# Patient Record
Sex: Female | Born: 1985 | Race: White | Hispanic: No | Marital: Married | Smoking: Never smoker
Health system: Southern US, Community
[De-identification: ages and names within clinical notes are randomized; demographics above are authoritative.]

## PROBLEM LIST (undated history)

## (undated) DIAGNOSIS — R51 Headache: Secondary | ICD-10-CM

## (undated) DIAGNOSIS — Q211 Atrial septal defect, unspecified: Secondary | ICD-10-CM

## (undated) DIAGNOSIS — Z8619 Personal history of other infectious and parasitic diseases: Secondary | ICD-10-CM

## (undated) HISTORY — DX: Atrial septal defect: Q21.1

## (undated) HISTORY — DX: Headache: R51

## (undated) HISTORY — DX: Personal history of other infectious and parasitic diseases: Z86.19

## (undated) HISTORY — DX: Atrial septal defect, unspecified: Q21.10

---
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
## 2006-09-09 ENCOUNTER — Other Ambulatory Visit: Admission: RE | Admit: 2006-09-09 | Discharge: 2006-09-09 | Payer: Self-pay | Admitting: *Deleted

## 2007-10-23 ENCOUNTER — Other Ambulatory Visit: Admission: RE | Admit: 2007-10-23 | Discharge: 2007-10-23 | Payer: Self-pay | Admitting: Obstetrics and Gynecology

## 2008-11-17 ENCOUNTER — Other Ambulatory Visit: Admission: RE | Admit: 2008-11-17 | Discharge: 2008-11-17 | Payer: Self-pay | Admitting: Obstetrics and Gynecology

## 2009-06-14 ENCOUNTER — Encounter: Admission: RE | Admit: 2009-06-14 | Discharge: 2009-06-14 | Payer: Self-pay | Admitting: Obstetrics and Gynecology

## 2009-11-29 ENCOUNTER — Other Ambulatory Visit: Admission: RE | Admit: 2009-11-29 | Discharge: 2009-11-29 | Payer: Self-pay | Admitting: Obstetrics and Gynecology

## 2009-12-07 ENCOUNTER — Encounter: Admission: RE | Admit: 2009-12-07 | Discharge: 2009-12-07 | Payer: Self-pay | Admitting: Obstetrics and Gynecology

## 2010-05-08 ENCOUNTER — Other Ambulatory Visit: Payer: Self-pay | Admitting: Obstetrics and Gynecology

## 2010-05-08 DIAGNOSIS — Z09 Encounter for follow-up examination after completed treatment for conditions other than malignant neoplasm: Secondary | ICD-10-CM

## 2010-05-31 ENCOUNTER — Ambulatory Visit
Admission: RE | Admit: 2010-05-31 | Discharge: 2010-05-31 | Disposition: A | Discharge status: Home / Self Care | Payer: No Typology Code available for payment source | Source: Ambulatory Visit | Attending: Obstetrics and Gynecology | Admitting: Obstetrics and Gynecology

## 2010-05-31 DIAGNOSIS — Z09 Encounter for follow-up examination after completed treatment for conditions other than malignant neoplasm: Secondary | ICD-10-CM

## 2011-01-05 ENCOUNTER — Other Ambulatory Visit: Payer: Self-pay

## 2011-01-05 LAB — OB RESULTS CONSOLE ABO/RH: RH Type: POSITIVE

## 2011-01-05 LAB — OB RESULTS CONSOLE RUBELLA ANTIBODY, IGM: Rubella: IMMUNE

## 2011-01-05 LAB — OB RESULTS CONSOLE HIV ANTIBODY (ROUTINE TESTING): HIV: NONREACTIVE

## 2011-01-31 ENCOUNTER — Other Ambulatory Visit: Payer: Self-pay | Admitting: Obstetrics and Gynecology

## 2011-01-31 ENCOUNTER — Other Ambulatory Visit (HOSPITAL_COMMUNITY)
Admission: RE | Admit: 2011-01-31 | Discharge: 2011-01-31 | Disposition: A | Discharge status: Home / Self Care | Payer: No Typology Code available for payment source | Source: Ambulatory Visit | Attending: Obstetrics and Gynecology | Admitting: Obstetrics and Gynecology

## 2011-01-31 DIAGNOSIS — Z01419 Encounter for gynecological examination (general) (routine) without abnormal findings: Secondary | ICD-10-CM | POA: Insufficient documentation

## 2011-01-31 DIAGNOSIS — Z113 Encounter for screening for infections with a predominantly sexual mode of transmission: Secondary | ICD-10-CM | POA: Insufficient documentation

## 2011-02-20 NOTE — L&D Delivery Note (Signed)
Delivery Note At 11:43 PM a viable female was delivered via Vaginal, Spontaneous Delivery (Presentation: Right Occiput Anterior).  APGAR: 8, 9; weight .   Placenta status: Intact, Spontaneous.  Cord: 3 vessels with the following complications: None.  Cord pH: na  Anesthesia: Epidural  Episiotomy: None Lacerations: 2nd degree Suture Repair: 3.0 vicryl Est. Blood Loss (mL): 300  Mom to postpartum.  Baby to nursery-stable.  Torien Ramroop J. 08/16/2011, 12:03 AM

## 2011-06-20 ENCOUNTER — Other Ambulatory Visit (HOSPITAL_COMMUNITY): Payer: Self-pay | Admitting: Obstetrics and Gynecology

## 2011-06-20 DIAGNOSIS — IMO0002 Reserved for concepts with insufficient information to code with codable children: Secondary | ICD-10-CM

## 2011-06-27 ENCOUNTER — Ambulatory Visit (HOSPITAL_COMMUNITY)
Admission: RE | Admit: 2011-06-27 | Discharge: 2011-06-27 | Disposition: A | Discharge status: Home / Self Care | Payer: No Typology Code available for payment source | Source: Ambulatory Visit | Attending: Obstetrics and Gynecology | Admitting: Obstetrics and Gynecology

## 2011-06-27 ENCOUNTER — Other Ambulatory Visit (HOSPITAL_COMMUNITY): Payer: Self-pay | Admitting: Obstetrics and Gynecology

## 2011-06-27 ENCOUNTER — Encounter (HOSPITAL_COMMUNITY): Payer: Self-pay

## 2011-06-27 DIAGNOSIS — O358XX0 Maternal care for other (suspected) fetal abnormality and damage, not applicable or unspecified: Secondary | ICD-10-CM | POA: Insufficient documentation

## 2011-06-27 DIAGNOSIS — O368390 Maternal care for abnormalities of the fetal heart rate or rhythm, unspecified trimester, not applicable or unspecified: Secondary | ICD-10-CM | POA: Insufficient documentation

## 2011-06-27 DIAGNOSIS — IMO0002 Reserved for concepts with insufficient information to code with codable children: Secondary | ICD-10-CM

## 2011-06-27 DIAGNOSIS — O352XX0 Maternal care for (suspected) hereditary disease in fetus, not applicable or unspecified: Secondary | ICD-10-CM | POA: Insufficient documentation

## 2011-06-27 DIAGNOSIS — Z3689 Encounter for other specified antenatal screening: Secondary | ICD-10-CM

## 2011-06-27 DIAGNOSIS — Z363 Encounter for antenatal screening for malformations: Secondary | ICD-10-CM | POA: Insufficient documentation

## 2011-06-27 DIAGNOSIS — Z1389 Encounter for screening for other disorder: Secondary | ICD-10-CM | POA: Insufficient documentation

## 2011-06-27 NOTE — Progress Notes (Signed)
Patient seen for ultrasound.  See full report in [facility].  Single IUP at 33 4/7 weeks. Rare premature atrial contractions noted with normal heart rate. Normal anatomic fetal survey.  The fetal heart anatomy appears normal; however, somewhat limited due to advanced gestational age Fetal growth is appropriate Normal amniotic fluid volume  Premature atrial contractions are benign dysrhythmias that resolve after delivery and require no additional evaluation unless persistent fetal tachycardia develops (> 200 bpm). Recommend follow up as clinically indicated.  Alpha Gula, MD

## 2011-06-28 ENCOUNTER — Encounter (HOSPITAL_COMMUNITY): Payer: Self-pay

## 2011-06-28 NOTE — Progress Notes (Signed)
Encounter addended by: Alessandra Bevels. Chase Picket, RN on: 06/28/2011  1:51 PM<BR>     Documentation filed: Episodes

## 2011-07-03 ENCOUNTER — Inpatient Hospital Stay (HOSPITAL_COMMUNITY): Admission: AD | Admit: 2011-07-03 | Payer: Self-pay | Source: Ambulatory Visit | Admitting: Obstetrics and Gynecology

## 2011-08-09 ENCOUNTER — Encounter (HOSPITAL_COMMUNITY): Payer: Self-pay | Admitting: *Deleted

## 2011-08-09 ENCOUNTER — Telehealth (HOSPITAL_COMMUNITY): Payer: Self-pay | Admitting: *Deleted

## 2011-08-09 NOTE — Telephone Encounter (Signed)
Preadmission screen  

## 2011-08-14 ENCOUNTER — Encounter (HOSPITAL_COMMUNITY): Payer: Self-pay

## 2011-08-14 ENCOUNTER — Inpatient Hospital Stay (HOSPITAL_COMMUNITY)
Admission: RE | Admit: 2011-08-14 | Discharge: 2011-08-17 | DRG: 775 | Disposition: A | Discharge status: Home / Self Care | Payer: No Typology Code available for payment source | Source: Ambulatory Visit | Attending: Obstetrics and Gynecology | Admitting: Obstetrics and Gynecology

## 2011-08-14 ENCOUNTER — Other Ambulatory Visit: Payer: Self-pay | Admitting: Obstetrics and Gynecology

## 2011-08-14 DIAGNOSIS — O480 Post-term pregnancy: Principal | ICD-10-CM | POA: Diagnosis present

## 2011-08-14 LAB — CBC
MCH: 34.1 pg — ABNORMAL HIGH (ref 26.0–34.0)
MCV: 96.7 fL (ref 78.0–100.0)
Platelets: 164 10*3/uL (ref 150–400)
RBC: 3.96 MIL/uL (ref 3.87–5.11)

## 2011-08-14 MED ORDER — LIDOCAINE HCL (PF) 1 % IJ SOLN
30.0000 mL | INTRAMUSCULAR | Status: DC | PRN
  Filled 2011-08-14: qty 30

## 2011-08-14 MED ORDER — IBUPROFEN 600 MG PO TABS: 600.0000 mg | ORAL_TABLET | Freq: Four times a day (QID) | ORAL | Status: DC | PRN

## 2011-08-14 MED ORDER — OXYTOCIN 40 UNITS IN LACTATED RINGERS INFUSION - SIMPLE MED: 62.5000 mL/h | Freq: Once | INTRAVENOUS | Status: DC

## 2011-08-14 MED ORDER — MISOPROSTOL 25 MCG QUARTER TABLET
25.0000 ug | ORAL_TABLET | ORAL | Status: DC | PRN
  Administered 2011-08-14 – 2011-08-15 (×2): 25 ug via VAGINAL
  Filled 2011-08-14 (×2): qty 0.25

## 2011-08-14 MED ORDER — LACTATED RINGERS IV SOLN
500.0000 mL | INTRAVENOUS | Status: DC | PRN
  Administered 2011-08-15 (×3): 1000 mL via INTRAVENOUS

## 2011-08-14 MED ORDER — OXYTOCIN BOLUS FROM INFUSION
250.0000 mL | Freq: Once | INTRAVENOUS | Status: DC
  Filled 2011-08-14: qty 500

## 2011-08-14 MED ORDER — LACTATED RINGERS IV SOLN
INTRAVENOUS | Status: DC
  Administered 2011-08-15 (×3): via INTRAVENOUS

## 2011-08-14 MED ORDER — FLEET ENEMA 7-19 GM/118ML RE ENEM: 1.0000 | ENEMA | RECTAL | Status: DC | PRN

## 2011-08-14 MED ORDER — ONDANSETRON HCL 4 MG/2ML IJ SOLN: 4.0000 mg | Freq: Four times a day (QID) | INTRAMUSCULAR | Status: DC | PRN

## 2011-08-14 MED ORDER — OXYTOCIN 40 UNITS IN LACTATED RINGERS INFUSION - SIMPLE MED
1.0000 m[IU]/min | INTRAVENOUS | Status: DC
  Administered 2011-08-15: 1 m[IU]/min via INTRAVENOUS
  Filled 2011-08-14: qty 1000

## 2011-08-14 MED ORDER — ACETAMINOPHEN 325 MG PO TABS: 650.0000 mg | ORAL_TABLET | ORAL | Status: DC | PRN

## 2011-08-14 MED ORDER — BUTORPHANOL TARTRATE 2 MG/ML IJ SOLN
1.0000 mg | INTRAMUSCULAR | Status: DC | PRN
  Administered 2011-08-15: 1 mg via INTRAVENOUS
  Filled 2011-08-14: qty 1

## 2011-08-14 MED ORDER — OXYCODONE-ACETAMINOPHEN 5-325 MG PO TABS: 1.0000 | ORAL_TABLET | ORAL | Status: DC | PRN

## 2011-08-14 MED ORDER — CITRIC ACID-SODIUM CITRATE 334-500 MG/5ML PO SOLN: 30.0000 mL | ORAL | Status: DC | PRN

## 2011-08-14 MED ORDER — TERBUTALINE SULFATE 1 MG/ML IJ SOLN
0.2500 mg | Freq: Once | INTRAMUSCULAR | Status: AC | PRN
  Filled 2011-08-14: qty 1

## 2011-08-15 ENCOUNTER — Inpatient Hospital Stay (HOSPITAL_COMMUNITY): Payer: No Typology Code available for payment source | Admitting: Anesthesiology

## 2011-08-15 ENCOUNTER — Encounter (HOSPITAL_COMMUNITY): Payer: Self-pay | Admitting: Anesthesiology

## 2011-08-15 ENCOUNTER — Encounter (HOSPITAL_COMMUNITY): Payer: Self-pay

## 2011-08-15 MED ORDER — EPHEDRINE 5 MG/ML INJ: 10.0000 mg | INTRAVENOUS | Status: DC | PRN

## 2011-08-15 MED ORDER — LIDOCAINE HCL (PF) 1 % IJ SOLN
INTRAMUSCULAR | Status: DC | PRN
  Administered 2011-08-15 (×2): 9 mL

## 2011-08-15 MED ORDER — PHENYLEPHRINE 40 MCG/ML (10ML) SYRINGE FOR IV PUSH (FOR BLOOD PRESSURE SUPPORT)
80.0000 ug | PREFILLED_SYRINGE | INTRAVENOUS | Status: DC | PRN
  Filled 2011-08-15: qty 5

## 2011-08-15 MED ORDER — FENTANYL 2.5 MCG/ML BUPIVACAINE 1/10 % EPIDURAL INFUSION (WH - ANES)
INTRAMUSCULAR | Status: DC | PRN
  Administered 2011-08-15: 14 mL/h via EPIDURAL

## 2011-08-15 MED ORDER — TERBUTALINE SULFATE 1 MG/ML IJ SOLN
0.2500 mg | Freq: Once | INTRAMUSCULAR | Status: AC
  Administered 2011-08-15: 0.25 mg via SUBCUTANEOUS

## 2011-08-15 MED ORDER — DIPHENHYDRAMINE HCL 50 MG/ML IJ SOLN: 12.5000 mg | INTRAMUSCULAR | Status: DC | PRN

## 2011-08-15 MED ORDER — EPHEDRINE 5 MG/ML INJ
10.0000 mg | INTRAVENOUS | Status: DC | PRN
  Filled 2011-08-15: qty 4

## 2011-08-15 MED ORDER — ZOLPIDEM TARTRATE 10 MG PO TABS
10.0000 mg | ORAL_TABLET | Freq: Every evening | ORAL | Status: DC | PRN
  Administered 2011-08-15: 10 mg via ORAL
  Filled 2011-08-15: qty 1

## 2011-08-15 MED ORDER — FENTANYL 2.5 MCG/ML BUPIVACAINE 1/10 % EPIDURAL INFUSION (WH - ANES)
14.0000 mL/h | INTRAMUSCULAR | Status: DC
  Administered 2011-08-15 (×3): 14 mL/h via EPIDURAL
  Filled 2011-08-15 (×4): qty 60

## 2011-08-15 MED ORDER — LACTATED RINGERS IV SOLN: 500.0000 mL | Freq: Once | INTRAVENOUS | Status: DC

## 2011-08-15 MED ORDER — PHENYLEPHRINE 40 MCG/ML (10ML) SYRINGE FOR IV PUSH (FOR BLOOD PRESSURE SUPPORT): 80.0000 ug | PREFILLED_SYRINGE | INTRAVENOUS | Status: DC | PRN

## 2011-08-15 NOTE — Progress Notes (Signed)
1110- in and out cath for of dk amber urine. Pt c/o feeling the urge to void before the cath.

## 2011-08-15 NOTE — Progress Notes (Signed)
0800- prolong decel noted. Dr. Richardson Dopp asked to come to the room. lr bolus started.02 at 10 liters by non-rebreather facemask started.

## 2011-08-15 NOTE — Anesthesia Preprocedure Evaluation (Signed)
Anesthesia Evaluation  Patient identified by MRN, date of birth, ID band Patient awake    Reviewed: Allergy & Precautions, H&P , NPO status , Patient's Chart, lab work & pertinent test results  Airway Mallampati: I TM Distance: >3 FB Neck ROM: full    Dental No notable dental hx.    Pulmonary neg pulmonary ROS,    Pulmonary exam normal       Cardiovascular negative cardio ROS      Neuro/Psych negative psych ROS   GI/Hepatic negative GI ROS, Neg liver ROS,   Endo/Other  negative endocrine ROS  Renal/GU negative Renal ROS  negative genitourinary   Musculoskeletal negative musculoskeletal ROS (+)   Abdominal Normal abdominal exam  (+)   Peds negative pediatric ROS (+)  Hematology negative hematology ROS (+)   Anesthesia Other Findings   Reproductive/Obstetrics (+) Pregnancy                           Anesthesia Physical Anesthesia Plan  ASA: II  Anesthesia Plan: Epidural   Post-op Pain Management:    Induction:   Airway Management Planned:   Additional Equipment:   Intra-op Plan:   Post-operative Plan:   Informed Consent: I have reviewed the patients History and Physical, chart, labs and discussed the procedure including the risks, benefits and alternatives for the proposed anesthesia with the patient or authorized representative who has indicated his/her understanding and acceptance.     Plan Discussed with:   Anesthesia Plan Comments:         Anesthesia Quick Evaluation  

## 2011-08-15 NOTE — H&P (Signed)
[name] is a 26 y.o. female presenting for  Induction due to post dates. +FM currently with contractions q minutes. + FM no vaginal bleeding no lof. Pregnancy has been complicated by Fetal pre-atrial contractions. She received 2 doses of cytotec overnight   OB History    Grav Para Term Preterm Abortions TAB SAB Ect Mult Living   1              Past Medical History  Diagnosis Date  . ASD (atrial septal defect)     age 37  . H/O varicella   . Headache    Past Surgical History  Procedure Date  . Asd repair 1992   Family History: family history is not on file. Social History:  reports that she has never smoked. She does not have any smokeless tobacco history on file. She reports that she does not drink alcohol or use illicit drugs.  ROS negative exceptas stated in HPI   Dilation: 1 Effacement (%): 60 Station: -1 Exam by:: a. white rn Blood pressure 105/57, pulse 85, temperature 97.6 F (36.4 C), temperature source Oral, resp. rate 18, height 5\' 3"  (1.6 m), weight 71.668 kg (158 lb), last menstrual period 10/10/2010, SpO2 96.00%. cv rrr  lungs clear  abd gravid nontender Ext no edema  cx 1.5/ 75/0  FHR baseline 150's good btbv + accels no decels  Toco: ctx q 1 minute   Prenatal labs: ABO, Rh: A/Positive/-- (11/16 0000) Antibody: Negative (11/16 0000) Rubella:   RPR: NON REACTIVE (06/25 2010)  HBsAg: Negative (11/16 0000)  HIV: Non-reactive (11/16 0000)  GBS: Negative (05/23 0000)   Assessment/Plan: 40 wks 3 days  For induction. She is aware of r/o cesarean section associated with induction and accept this risk. She desires an epidural for pain.   tachysystole/ may require terbutaline if fetal decels occur  WIll monitor closely    Anyelo Mccue J. 08/15/2011, 7:41 AM

## 2011-08-15 NOTE — Anesthesia Procedure Notes (Signed)
Epidural Patient location during procedure: OB Start time: 08/15/2011 9:10 AM End time: 08/15/2011 9:14 AM Reason for block: procedure for pain  Staffing Anesthesiologist: Sandrea Hughs Performed by: anesthesiologist   Preanesthetic Checklist Completed: patient identified, site marked, surgical consent, pre-op evaluation, timeout performed, IV checked, risks and benefits discussed and monitors and equipment checked  Epidural Patient position: sitting Prep: site prepped and draped and DuraPrep Patient monitoring: continuous pulse ox and blood pressure Approach: midline Injection technique: LOR air  Needle:  Needle type: Tuohy  Needle gauge: 17 G Needle length: 9 cm Needle insertion depth: 5 cm cm Catheter type: closed end flexible Catheter size: 19 Gauge Catheter at skin depth: 10 cm Test dose: negative and Other  Assessment Sensory level: T9 Events: blood not aspirated, injection not painful, no injection resistance, negative IV test and no paresthesia

## 2011-08-15 NOTE — Progress Notes (Signed)
In to assess patient.  She is comfortable with her epidural   cx 2/ 75/ -1 Arom clear fluid  IUPC and FSE placed  FHR baseline 140's good btbv +acels variable decel noted when patient was sitting to there 80's for 1 minute.. Returned to baseline with good btbv  Toco ctx q 1-3 minutes  A/P 40 wks and 3 days for induction due to post dates.  Expectant management.

## 2011-08-15 NOTE — Progress Notes (Signed)
02 dc'd

## 2011-08-15 NOTE — Progress Notes (Signed)
In to assess patient  Cx 7/90/+1 caput noted.. FHR baseline 140 good btbv +accels no decels toco ctx q 3 minutes mvu 180-200 A/P 40 wks and 3 days postdates for induction Anticipate SVD

## 2011-08-15 NOTE — Progress Notes (Signed)
[id number]- prolonged fhr decel noted. 02 at 10 liters by non-rebreather facemask, lr bolus started. Pt 's position chaned from semi-fowler's to left lateral.

## 2011-08-16 ENCOUNTER — Encounter (HOSPITAL_COMMUNITY): Payer: Self-pay

## 2011-08-16 LAB — CBC
MCH: 33.2 pg (ref 26.0–34.0)
MCV: 97.1 fL (ref 78.0–100.0)
Platelets: 152 10*3/uL (ref 150–400)
RDW: 13.3 % (ref 11.5–15.5)
WBC: 15.9 10*3/uL — ABNORMAL HIGH (ref 4.0–10.5)

## 2011-08-16 MED ORDER — SIMETHICONE 80 MG PO CHEW: 80.0000 mg | CHEWABLE_TABLET | ORAL | Status: DC | PRN

## 2011-08-16 MED ORDER — DIPHENHYDRAMINE HCL 25 MG PO CAPS: 25.0000 mg | ORAL_CAPSULE | Freq: Four times a day (QID) | ORAL | Status: DC | PRN

## 2011-08-16 MED ORDER — ONDANSETRON HCL 4 MG PO TABS: 4.0000 mg | ORAL_TABLET | ORAL | Status: DC | PRN

## 2011-08-16 MED ORDER — ONDANSETRON HCL 4 MG/2ML IJ SOLN: 4.0000 mg | INTRAMUSCULAR | Status: DC | PRN

## 2011-08-16 MED ORDER — PRENATAL MULTIVITAMIN CH
1.0000 | ORAL_TABLET | Freq: Every day | ORAL | Status: DC
  Administered 2011-08-16 – 2011-08-17 (×2): 1 via ORAL
  Filled 2011-08-16 (×2): qty 1

## 2011-08-16 MED ORDER — TETANUS-DIPHTH-ACELL PERTUSSIS 5-2.5-18.5 LF-MCG/0.5 IM SUSP: 0.5000 mL | Freq: Once | INTRAMUSCULAR | Status: DC

## 2011-08-16 MED ORDER — METHYLERGONOVINE MALEATE 0.2 MG/ML IJ SOLN: 0.2000 mg | INTRAMUSCULAR | Status: DC | PRN

## 2011-08-16 MED ORDER — WITCH HAZEL-GLYCERIN EX PADS: 1.0000 "application " | MEDICATED_PAD | CUTANEOUS | Status: DC | PRN

## 2011-08-16 MED ORDER — LANOLIN HYDROUS EX OINT: TOPICAL_OINTMENT | CUTANEOUS | Status: DC | PRN

## 2011-08-16 MED ORDER — ZOLPIDEM TARTRATE 5 MG PO TABS: 5.0000 mg | ORAL_TABLET | Freq: Every evening | ORAL | Status: DC | PRN

## 2011-08-16 MED ORDER — SENNOSIDES-DOCUSATE SODIUM 8.6-50 MG PO TABS
2.0000 | ORAL_TABLET | Freq: Every day | ORAL | Status: DC
  Administered 2011-08-16: 2 via ORAL

## 2011-08-16 MED ORDER — FERROUS SULFATE 325 (65 FE) MG PO TABS
325.0000 mg | ORAL_TABLET | Freq: Two times a day (BID) | ORAL | Status: DC
  Administered 2011-08-16 – 2011-08-17 (×3): 325 mg via ORAL
  Filled 2011-08-16 (×3): qty 1

## 2011-08-16 MED ORDER — METHYLERGONOVINE MALEATE 0.2 MG PO TABS: 0.2000 mg | ORAL_TABLET | ORAL | Status: DC | PRN

## 2011-08-16 MED ORDER — OXYCODONE-ACETAMINOPHEN 5-325 MG PO TABS: 1.0000 | ORAL_TABLET | ORAL | Status: DC | PRN

## 2011-08-16 MED ORDER — DIBUCAINE 1 % RE OINT
1.0000 "application " | TOPICAL_OINTMENT | RECTAL | Status: DC | PRN
  Administered 2011-08-16: 1 via RECTAL
  Filled 2011-08-16: qty 28

## 2011-08-16 MED ORDER — BENZOCAINE-MENTHOL 20-0.5 % EX AERO
1.0000 "application " | INHALATION_SPRAY | CUTANEOUS | Status: DC | PRN
  Administered 2011-08-16: 1 via TOPICAL
  Filled 2011-08-16: qty 56

## 2011-08-16 MED ORDER — IBUPROFEN 600 MG PO TABS
600.0000 mg | ORAL_TABLET | Freq: Four times a day (QID) | ORAL | Status: DC
  Administered 2011-08-16 – 2011-08-17 (×5): 600 mg via ORAL
  Filled 2011-08-16 (×5): qty 1

## 2011-08-16 NOTE — Progress Notes (Signed)
Post Partum Day 1 s/p vaginal delivery  Subjective: no complaints, up ad lib, voiding and tolerating PO  Objective: Blood pressure 107/70, pulse 99, temperature 98.4 F (36.9 C), temperature source Oral, resp. rate 20, height 5\' 3"  (1.6 m), weight 71.668 kg (158 lb), last menstrual period 10/10/2010, SpO2 96.00%, unknown if currently breastfeeding.  Physical Exam:  General: alert and cooperative Lochia: appropriate Uterine Fundus: firm Incision: NA DVT Evaluation: No evidence of DVT seen on physical exam.   Basename 08/16/11 0530 08/14/11 2010  HGB 12.4 13.5  HCT 36.2 38.3    Assessment/Plan: Plan for discharge tomorrow and Breastfeeding  routine postpartum  Care    LOS: 2 days   [name] J. 08/16/2011, 6:34 AM

## 2011-08-16 NOTE — Discharge Instructions (Signed)

## 2011-08-16 NOTE — Progress Notes (Signed)
UR chart review completed.  

## 2011-08-16 NOTE — Anesthesia Postprocedure Evaluation (Signed)
Anesthesia Post Note  Patient: [name]  Procedure(s) Performed: * No procedures listed *  Anesthesia type: Epidural  Patient location: Mother/Baby  Post pain: Pain level controlled  Post assessment: Post-op Vital signs reviewed  Last Vitals:  Filed Vitals:   08/16/11 0630  BP: 107/69  Pulse: 65  Temp: 36.6 C  Resp: 18    Post vital signs: Reviewed  Level of consciousness: awake  Complications: No apparent anesthesia complications

## 2011-08-17 MED ORDER — HYDROCORTISONE ACE-PRAMOXINE 1-1 % RE FOAM: 1.0000 | Freq: Two times a day (BID) | RECTAL | Status: AC

## 2011-08-17 MED ORDER — IBUPROFEN 600 MG PO TABS: 600.0000 mg | ORAL_TABLET | Freq: Four times a day (QID) | ORAL | Status: AC

## 2011-08-17 NOTE — Discharge Summary (Signed)
Obstetric Discharge Summary Reason for Admission: induction of labor Prenatal Procedures: ultrasound Intrapartum Procedures: spontaneous vaginal delivery Postpartum Procedures: none Complications-Operative and Postpartum: 2nd degree perineal laceration Hemoglobin  Date Value Range Status  08/16/2011 12.4  12.0 - 15.0 g/dL Final     HCT  Date Value Range Status  08/16/2011 36.2  36.0 - 46.0 % Final    Physical Exam: WNL, small hemorrhoids  Discharge Diagnoses: Term Pregnancy-delivered  Discharge Information: Date: 08/17/2011 Activity: pelvic rest Diet: routine Medications: PNV, Ibuprofen and Proctofoam HC Condition: stable Instructions: See discharge instructions. Discharge to: home Follow-up Information    Follow up with Jessee Avers., MD in 6 weeks. (Postpartume check up)    Contact information:   [address] [place] [address] [phone]          Newborn Data: Live born female  Birth Weight: 6 lb 12.5 oz (3075 g) APGAR: 8, 9  Home with mother.  Geryl Rankins 08/17/2011, 9:08 AM

## 2011-08-17 NOTE — Progress Notes (Addendum)
Post Partum Day 2 Subjective: Pt with complaint of hemorrhoids.  She has used a topical ointment.  Sitz bath used late seemed to help best.  Lochia wnl.  Objective: Blood pressure 113/75, pulse 78, temperature 97.5 F (36.4 C), temperature source Oral, resp. rate 18, height 5\' 3"  (1.6 m), weight 71.668 kg (158 lb), last menstrual period 10/10/2010, SpO2 96.00%, unknown if currently breastfeeding.  Physical Exam:  General: alert, cooperative and no distress Lochia: appropriate Uterine Fundus: firm DVT Evaluation: No evidence of DVT seen on physical exam. Perineum:  2 very small hemorrhoids, no thrombosis, nontender   Basename 08/16/11 0530 08/14/11 2010  HGB 12.4 13.5  HCT 36.2 38.3    Assessment/Plan: Discharge home Recommend pt continue Sitz baths at home.  Will give rx for Proctofoam HC also. PP Depression precautions given.  Pt to F/u with Dr. Richardson Bates in 6 weeks.   LOS: 3 days   [name] 08/17/2011, 8:06 AM

## 2012-02-04 ENCOUNTER — Other Ambulatory Visit (HOSPITAL_COMMUNITY)
Admission: RE | Admit: 2012-02-04 | Discharge: 2012-02-04 | Disposition: A | Discharge status: Home / Self Care | Payer: 59 | Source: Ambulatory Visit | Attending: Obstetrics and Gynecology | Admitting: Obstetrics and Gynecology

## 2012-02-04 ENCOUNTER — Other Ambulatory Visit: Payer: Self-pay | Admitting: Obstetrics and Gynecology

## 2012-02-04 DIAGNOSIS — Z01419 Encounter for gynecological examination (general) (routine) without abnormal findings: Secondary | ICD-10-CM | POA: Insufficient documentation

## 2013-01-17 ENCOUNTER — Telehealth: Payer: Self-pay | Admitting: Obstetrics and Gynecology

## 2013-01-17 NOTE — Telephone Encounter (Signed)
TC from pt at 25wks c/o chest pain ?reflux, also reports some SOB. Recommended pt come to MAU, pt states she's out of town in [facility], advised pt to go to nearest hospital for evaluation. Pt verbalized understanding.

## 2013-02-19 NOTE — L&D Delivery Note (Signed)
    Delivery Note   27yo G2P1001 @ 2952w5d who presented transitioning to active labor.  Pregnancy uncomplicated to date.  She received an epidural for pain.  She was augmented with AROM clear fluid and Pitocin; however, the pitocin was turned off as the patient was contracting regularly on her own.  She progressed to complete dilation.  At 11:57 PM a viable and healthy female was delivered via Vaginal, Spontaneous Delivery (Presentation: ROA).  APGAR: 9, 9; weight 7 lb 11.5 oz (3500 g).  Placenta status: Intact, Spontaneous.  Cord: 3 vessels with the following complications: None.  2nd degree laceration repaired in the usual fashion with 2-0 and 3-0 vicryl.  Anesthesia: Epidural  Episiotomy: None Lacerations: 2nd degree Suture Repair: 2.0 3.0 vicryl Est. Blood Loss (mL): 300cc  Mom to postpartum.  Baby to Couplet care / Skin to Skin.  Myna HidalgoZAN, Monica Zahler, M 04/28/2013, 12:20 AM

## 2013-04-26 ENCOUNTER — Encounter (HOSPITAL_COMMUNITY): Payer: Self-pay | Admitting: *Deleted

## 2013-04-26 ENCOUNTER — Telehealth (HOSPITAL_COMMUNITY): Payer: Self-pay | Admitting: Obstetrics and Gynecology

## 2013-04-26 ENCOUNTER — Inpatient Hospital Stay (HOSPITAL_COMMUNITY)
Admission: AD | Admit: 2013-04-26 | Discharge: 2013-04-26 | Disposition: A | Discharge status: Home / Self Care | Payer: 59 | Source: Ambulatory Visit | Attending: Obstetrics and Gynecology | Admitting: Obstetrics and Gynecology

## 2013-04-26 DIAGNOSIS — O479 False labor, unspecified: Secondary | ICD-10-CM | POA: Insufficient documentation

## 2013-04-26 DIAGNOSIS — Z8742 Personal history of other diseases of the female genital tract: Secondary | ICD-10-CM

## 2013-04-26 DIAGNOSIS — Z8774 Personal history of (corrected) congenital malformations of heart and circulatory system: Secondary | ICD-10-CM

## 2013-04-26 NOTE — Telephone Encounter (Signed)
TC from patient--f/u from previous call 1 hour ago.  UCs now q 6 min.  Requests evaluation in MAU. Will see in MAU.  [name], CNM 04/26/13 9:15a

## 2013-04-26 NOTE — Telephone Encounter (Signed)
TC from patient--Patient of Dr. Richardson Doppole, 39 weeks, 2nd baby, UCs q 10 min, moderate, +FM.  No complications, GBS negative.  No recent VE. Reviewed options for evaluation in MAU now or observation at present, with f/u with me prn. Patient will CTO at present, will call with s/s of advancing labor.  Nigel BridgemanVicki Marlon Vonruden, CNM 04/26/13 8a

## 2013-04-26 NOTE — MAU Note (Signed)
Pt presents with complaints of contractions that started around 330. Denies any vaginal bleeding or LOF.

## 2013-04-26 NOTE — MAU Provider Note (Signed)
History   28 yo G2P1001 presented after calling to report UCs since 3am, now q 6 min.  Denies leaking or bleeding, reports +FM. [facility] by Dr. Richardson Doppole.  No recent exam.  Reports had difficult experience with induction and very strong UCs, and feels these contractions are "nowhere near that", so she is unsure of quality.  Patient Active Problem List   Diagnosis Date Noted  . History of irregular menstrual cycles 04/26/2013   History of present pregnancy: Patient entered care at 11/5/7 weeks.   EDC of 04/29/13 was established by LMP and 18 week US   Anatomy scan:  18 6/7 weeks, with normal findings.   Additional US evaluations:  None   Significant prenatal events:  Uncomplicated pregnancy.  Received TDaP 02/18/14. Last evaluation:  Last week, no VE   Prenatal Transfer Tool  Maternal Diabetes: No Genetic Screening: Declined Maternal Ultrasounds/Referrals: Normal Fetal Ultrasounds or other Referrals:  None Maternal Substance Abuse:  No Significant Maternal Medications:  None Significant Maternal Lab Results: Lab values include: Group B Strep negative     Chief Complaint  Patient presents with  . Labor Eval   HPI:  See above  OB History   Grav Para Term Preterm Abortions TAB SAB Ect Mult Living   2 1 1  0 0 0 0 0 0 1    2013--SVB, 41 weeks, 18 hour labor, induced, delivered by Dr. Richardson Doppole, female, [phone]  Past Medical History  Diagnosis Date  . ASD (atrial septal defect)     age 89  . H/O varicella   . WJXBJYNW(295.6Headache(784.0)     Past Surgical History  Procedure Laterality Date  . Asd repair  1992    History reviewed. No pertinent family history.  History  Substance Use Topics  . Smoking status: Never Smoker   . Smokeless tobacco: Never Used  . Alcohol Use: No    Allergies: No Known Allergies  Prescriptions prior to admission  Medication Sig Dispense Refill  . acetaminophen (TYLENOL) 325 MG tablet Take 325 mg by mouth every 6 (six) hours as needed  for headache.      . Prenatal Vit-Fe Fumarate-FA (PRENATAL MULTIVITAMIN) TABS Take 1 tablet by mouth daily.         Prenatal Labs: A+ Neg antibody RPR NR Rubella Immune HBsAG Neg Varicella immune GBS negative TSH 1.16 Cultures negative 10/13/13 Hgb 11.4 at [facility] Declined genetic screening Negative CF screening in previous pregnancy   ROS:  Contractions, +FM Physical Exam   Blood pressure 132/81, pulse 83, temperature 98.2 F (36.8 C), resp. rate 18, height 5\' 3"  (1.6 m), weight 160 lb (72.576 kg), last menstrual period 07/23/2012, unknown if currently breastfeeding.  Physical Exam Chest clear Heart RRR without murmur Abd gravid, NT Pelvic--tight 2 cm, 70%, vtx, -1 Ext WNL  FHR Category 1 UCs q 4 min, mild at present  ED Course  IUP at 39 4/7 weeks Early vs prodromal labor GBS negative  Plan: Ambulate x 1 hour, then re-evaluate.   Nigel BridgemanLATHAM, Nyrah Demos CNM, MN 04/26/2013 9:43 AM  Addendum: Returned from walking.  Feels some UCs are stronger. Category 1 FHR UCs q 4-6 min, mild/moderate. Cervix unchanged--2 cm, 70%, vtx, -1, cervix firm.  Issue of prodromal/latent phase labor reviewed, with need to allow it to progress unimpeded/without intervention at present. Patient agreeable with that plan. D/C'd home with labor precautions. F/u here as needed, or with [facility] tomorrow, if no advancement of labor.  Nigel BridgemanVicki Daire Okimoto, CNM 04/26/13  12:50p

## 2013-04-26 NOTE — Discharge Instructions (Signed)

## 2013-04-27 ENCOUNTER — Encounter (HOSPITAL_COMMUNITY): Payer: Self-pay | Admitting: *Deleted

## 2013-04-27 ENCOUNTER — Encounter (HOSPITAL_COMMUNITY): Payer: 59 | Admitting: Anesthesiology

## 2013-04-27 ENCOUNTER — Other Ambulatory Visit: Payer: Self-pay | Admitting: Obstetrics and Gynecology

## 2013-04-27 ENCOUNTER — Inpatient Hospital Stay (HOSPITAL_COMMUNITY): Payer: 59 | Admitting: Anesthesiology

## 2013-04-27 ENCOUNTER — Inpatient Hospital Stay (HOSPITAL_COMMUNITY)
Admission: AD | Admit: 2013-04-27 | Discharge: 2013-04-29 | DRG: 775 | Disposition: A | Discharge status: Home / Self Care | Payer: 59 | Source: Ambulatory Visit | Attending: Obstetrics and Gynecology | Admitting: Obstetrics and Gynecology

## 2013-04-27 DIAGNOSIS — Z8774 Personal history of (corrected) congenital malformations of heart and circulatory system: Secondary | ICD-10-CM

## 2013-04-27 DIAGNOSIS — IMO0001 Reserved for inherently not codable concepts without codable children: Secondary | ICD-10-CM

## 2013-04-27 DIAGNOSIS — Z8742 Personal history of other diseases of the female genital tract: Secondary | ICD-10-CM

## 2013-04-27 LAB — CBC
HEMATOCRIT: 35.3 % — AB (ref 36.0–46.0)
Hemoglobin: 12.5 g/dL (ref 12.0–15.0)
MCH: 33.3 pg (ref 26.0–34.0)
MCHC: 35.4 g/dL (ref 30.0–36.0)
MCV: 94.1 fL (ref 78.0–100.0)
Platelets: 166 10*3/uL (ref 150–400)
RBC: 3.75 MIL/uL — ABNORMAL LOW (ref 3.87–5.11)
RDW: 13.3 % (ref 11.5–15.5)
WBC: 6.7 10*3/uL (ref 4.0–10.5)

## 2013-04-27 LAB — RPR: RPR Ser Ql: NONREACTIVE

## 2013-04-27 LAB — OB RESULTS CONSOLE RPR: RPR: NONREACTIVE

## 2013-04-27 LAB — OB RESULTS CONSOLE GC/CHLAMYDIA
Chlamydia: NEGATIVE
Gonorrhea: NEGATIVE

## 2013-04-27 LAB — OB RESULTS CONSOLE HIV ANTIBODY (ROUTINE TESTING): HIV: NONREACTIVE

## 2013-04-27 LAB — OB RESULTS CONSOLE GBS: STREP GROUP B AG: NEGATIVE

## 2013-04-27 MED ORDER — OXYCODONE-ACETAMINOPHEN 5-325 MG PO TABS: 1.0000 | ORAL_TABLET | ORAL | Status: DC | PRN

## 2013-04-27 MED ORDER — FENTANYL 2.5 MCG/ML BUPIVACAINE 1/10 % EPIDURAL INFUSION (WH - ANES)
14.0000 mL/h | INTRAMUSCULAR | Status: DC | PRN
  Administered 2013-04-27: 14 mL/h via EPIDURAL
  Filled 2013-04-27: qty 125

## 2013-04-27 MED ORDER — PHENYLEPHRINE 40 MCG/ML (10ML) SYRINGE FOR IV PUSH (FOR BLOOD PRESSURE SUPPORT)
80.0000 ug | PREFILLED_SYRINGE | INTRAVENOUS | Status: DC | PRN
  Filled 2013-04-27: qty 2

## 2013-04-27 MED ORDER — PHENYLEPHRINE 40 MCG/ML (10ML) SYRINGE FOR IV PUSH (FOR BLOOD PRESSURE SUPPORT)
PREFILLED_SYRINGE | INTRAVENOUS | Status: AC
  Filled 2013-04-27: qty 5

## 2013-04-27 MED ORDER — OXYTOCIN BOLUS FROM INFUSION
500.0000 mL | INTRAVENOUS | Status: DC
  Administered 2013-04-28: 500 mL via INTRAVENOUS

## 2013-04-27 MED ORDER — ACETAMINOPHEN 325 MG PO TABS: 650.0000 mg | ORAL_TABLET | ORAL | Status: DC | PRN

## 2013-04-27 MED ORDER — LIDOCAINE HCL (PF) 1 % IJ SOLN
INTRAMUSCULAR | Status: DC | PRN
  Administered 2013-04-27 (×4): 4 mL

## 2013-04-27 MED ORDER — IBUPROFEN 600 MG PO TABS: 600.0000 mg | ORAL_TABLET | Freq: Four times a day (QID) | ORAL | Status: DC | PRN

## 2013-04-27 MED ORDER — EPHEDRINE 5 MG/ML INJ
10.0000 mg | INTRAVENOUS | Status: DC | PRN
  Filled 2013-04-27: qty 2

## 2013-04-27 MED ORDER — OXYTOCIN 40 UNITS IN LACTATED RINGERS INFUSION - SIMPLE MED: 62.5000 mL/h | INTRAVENOUS | Status: DC

## 2013-04-27 MED ORDER — LACTATED RINGERS IV SOLN
500.0000 mL | INTRAVENOUS | Status: DC | PRN
  Administered 2013-04-27: 1000 mL via INTRAVENOUS
  Administered 2013-04-27: 500 mL via INTRAVENOUS

## 2013-04-27 MED ORDER — TERBUTALINE SULFATE 1 MG/ML IJ SOLN: 0.2500 mg | Freq: Once | INTRAMUSCULAR | Status: AC | PRN

## 2013-04-27 MED ORDER — FENTANYL 2.5 MCG/ML BUPIVACAINE 1/10 % EPIDURAL INFUSION (WH - ANES)
INTRAMUSCULAR | Status: AC
  Administered 2013-04-27: 14 mL/h via EPIDURAL
  Filled 2013-04-27: qty 125

## 2013-04-27 MED ORDER — OXYTOCIN 40 UNITS IN LACTATED RINGERS INFUSION - SIMPLE MED
1.0000 m[IU]/min | INTRAVENOUS | Status: DC
  Administered 2013-04-27: 2 m[IU]/min via INTRAVENOUS
  Filled 2013-04-27: qty 1000

## 2013-04-27 MED ORDER — CITRIC ACID-SODIUM CITRATE 334-500 MG/5ML PO SOLN: 30.0000 mL | ORAL | Status: DC | PRN

## 2013-04-27 MED ORDER — LACTATED RINGERS IV SOLN: 500.0000 mL | Freq: Once | INTRAVENOUS | Status: DC

## 2013-04-27 MED ORDER — DIPHENHYDRAMINE HCL 50 MG/ML IJ SOLN: 12.5000 mg | INTRAMUSCULAR | Status: DC | PRN

## 2013-04-27 MED ORDER — ONDANSETRON HCL 4 MG/2ML IJ SOLN: 4.0000 mg | Freq: Four times a day (QID) | INTRAMUSCULAR | Status: DC | PRN

## 2013-04-27 MED ORDER — LIDOCAINE HCL (PF) 1 % IJ SOLN
30.0000 mL | INTRAMUSCULAR | Status: DC | PRN
  Filled 2013-04-27: qty 30

## 2013-04-27 MED ORDER — LACTATED RINGERS IV SOLN
INTRAVENOUS | Status: DC
  Administered 2013-04-27 (×2): via INTRAVENOUS

## 2013-04-27 MED ORDER — EPHEDRINE 5 MG/ML INJ
INTRAVENOUS | Status: AC
  Filled 2013-04-27: qty 4

## 2013-04-27 NOTE — Anesthesia Preprocedure Evaluation (Signed)
Anesthesia Evaluation  Patient identified by MRN, date of birth, ID band Patient awake    Reviewed: Allergy & Precautions, H&P , NPO status , Patient's Chart, lab work & pertinent test results, reviewed documented beta blocker date and time   History of Anesthesia Complications Negative for: history of anesthetic complications  Airway Mallampati: III  TM Distance: >3 FB Neck ROM: full    Dental  (+) Teeth Intact   Pulmonary neg pulmonary ROS,    breath sounds clear to auscultation       Cardiovascular  Rhythm:regular Rate:Normal  H/o ASD repair at 28 yo, released from cardiology follow-up at 28 yo   Neuro/Psych negative neurological ROS  negative psych ROS   GI/Hepatic negative GI ROS, Neg liver ROS,   Endo/Other  negative endocrine ROS  Renal/GU negative Renal ROS     Musculoskeletal   Abdominal   Peds  Hematology negative hematology ROS (+)   Anesthesia Other Findings   Reproductive/Obstetrics (+) Pregnancy                             Anesthesia Physical  Anesthesia Plan  ASA: II  Anesthesia Plan: Epidural   Post-op Pain Management:    Induction:   Airway Management Planned:   Additional Equipment:   Intra-op Plan:   Post-operative Plan:   Informed Consent: I have reviewed the patients History and Physical, chart, labs and discussed the procedure including the risks, benefits and alternatives for the proposed anesthesia with the patient or authorized representative who has indicated his/her understanding and acceptance.     Plan Discussed with:   Anesthesia Plan Comments:         Anesthesia Quick Evaluation  

## 2013-04-27 NOTE — Progress Notes (Signed)
OB PN  S: Patient resting comfortably, no acute complaints.  O: BP 102/56  Pulse 85  Temp(Src) 98.7 F (37.1 C) (Oral)  Resp 18  Ht 5\' 3"  (1.6 m)  Wt 72.576 kg (160 lb)  BMI 28.35 kg/m2  SpO2 99%  LMP 07/23/2012  FHT: 120, moderate variability, + accels, no decels Toco: q2-334min SVE: ant lip/100/+1  CBC    Component Value Date/Time   WBC 6.7 04/27/2013 1330   RBC 3.75* 04/27/2013 1330   HGB 12.5 04/27/2013 1330   HCT 35.3* 04/27/2013 1330   PLT 166 04/27/2013 1330   MCV 94.1 04/27/2013 1330   MCH 33.3 04/27/2013 1330   MCHC 35.4 04/27/2013 1330   RDW 13.3 04/27/2013 1330    A/P: 27yo G2P1001 @ 2846w5d now in active labor. 1. FWB- Cat. I 2. Labor- expectant management -Pain- continue epidural 3. GBS neg  Myna HidalgoJennifer Philmore Lepore, DO [phone] (pager) [phone] (office)

## 2013-04-27 NOTE — Progress Notes (Signed)
Reported SVE, UC activity. Reported that pitocin is off due to prolonged and variables.

## 2013-04-27 NOTE — Progress Notes (Signed)
Dr. Charlotta Newtonzan recommends to labor down since anterior lip is not reducing. Will commence pushing at 11:30pm.

## 2013-04-27 NOTE — Progress Notes (Signed)
Told Dr. Charlotta Newtonzan 5 UCs in 10 min. Dr. Charlotta Newtonzan ordered to do SVE at 10pm and call with exam. If no change, may place IUPC.

## 2013-04-27 NOTE — Anesthesia Procedure Notes (Signed)
Epidural Patient location during procedure: OB Start time: 04/27/2013 2:13 PM  Staffing Performed by: anesthesiologist   Preanesthetic Checklist Completed: patient identified, site marked, surgical consent, pre-op evaluation, timeout performed, IV checked, risks and benefits discussed and monitors and equipment checked  Epidural Patient position: sitting Prep: site prepped and draped and DuraPrep Patient monitoring: continuous pulse ox and blood pressure Approach: midline Injection technique: LOR air  Needle:  Needle type: Tuohy  Needle gauge: 17 G Needle length: 9 cm and 9 Needle insertion depth: 5 cm cm Catheter type: closed end flexible Catheter size: 19 Gauge Catheter at skin depth: 10 cm Test dose: negative  Assessment Events: blood not aspirated, injection not painful, no injection resistance, negative IV test and no paresthesia  Additional Notes Discussed risk of headache, infection, bleeding, nerve injury and failed or incomplete block.  Patient voices understanding and wishes to proceed.  Epidural placed easily on first attempt.  No paresthesia.  Patient tolerated procedure well with no apparent complications.  Jasmine DecemberA. Bernhard Koskinen, MDReason for block:procedure for pain

## 2013-04-27 NOTE — H&P (Signed)
[name] is a 28 y.o. female presenting at 1739 wks and 5days  Based on 7 wk ultrasound with EDD 04/19/2012. Prenatal care with [facility] Richardson [facility]ole at [facility].  Pregnancy has been uncomplicated. Pt presented to office complaining of contraction q5-7 minutes with increased intensity since seen in MAU the night before. In the office her cervix was 4/75/-1.. Admitted for labor . Since arrival to MAU she has received an epidural and pain is well controlled. Contractions have spaced out to every 5-10 minutes.    History OB History   Grav Para Term Preterm Abortions TAB SAB Ect Mult Living   2 1 1  0 0 0 0 0 0 1     Past Medical History  Diagnosis Date  . ASD (atrial septal defect)     age 89  . H/O varicella   . WUJWJXBJ(478.2Headache(784.0)    Past Surgical History  Procedure Laterality Date  . Asd repair  1992   Family History: family history is not on file. Social History:  reports that she has never smoked. She has never used smokeless tobacco. She reports that she does not drink alcohol or use illicit drugs.   Prenatal Transfer Tool  Maternal Diabetes: No Genetic Screening: Normal Maternal Ultrasounds/Referrals: Normal Fetal Ultrasounds or other Referrals:  None Maternal Substance Abuse:  No Significant Maternal Medications:  None Significant Maternal Lab Results:  Lab values include: Group B Strep negative Other Comments:  none  Review of Systems  All other systems reviewed and are negative.    Dilation: 4.5 Effacement (%): 70 Station: -1 Exam by:: Dr Richardson Doppole Blood pressure 115/65, pulse 88, temperature 98.9 F (37.2 C), temperature source Oral, resp. rate 16, height 5\' 3"  (1.6 m), weight 72.576 kg (160 lb), last menstrual period 07/23/2012, SpO2 99.00%, unknown if currently breastfeeding. Maternal Exam:  Uterine Assessment: Contraction strength is moderate.  Contraction frequency is regular.   Abdomen: Fetal presentation: vertex  Introitus: Normal vulva. Normal vagina.    Fetal  Exam Fetal Monitor Review: Mode: fetoscope.   Baseline rate: 135.  Variability: moderate (6-25 bpm).   Pattern: accelerations present and no decelerations.    Fetal State Assessment: Category I - tracings are normal.     Physical Exam  Vitals reviewed. Constitutional: She is oriented to person, place, and time. She appears well-developed and well-nourished.  Neck: Normal range of motion.  Cardiovascular: Normal rate and regular rhythm.   Respiratory: Effort normal and breath sounds normal.  GI: There is no tenderness.  Genitourinary: Vagina normal.  4.5 /75/-1 Arom clear fluid.   Musculoskeletal: Normal range of motion. She exhibits no edema.  Neurological: She is alert and oriented to person, place, and time.  Skin: Skin is warm and dry.    Prenatal labs: ABO, Rh:   A positive  Antibody:  Negative  Rubella:  Immune  RPR: Nonreactive (03/09 1321)  HBsAg:   Negative  HIV: Non-reactive (03/09 1321)  GBS: Negative (03/09 1321)   Assessment/Plan: 39 wks 5 days in active labor.  AROM if contraction pattern does not increase plan pitocin for augmentation.   Anticipate SVD GBS negative. Dr. Charlotta Newtonzan covering after 5 pm today.     Markia Kyer J. 04/27/2013, 5:11 PM

## 2013-04-28 ENCOUNTER — Encounter (HOSPITAL_COMMUNITY): Payer: Self-pay | Admitting: *Deleted

## 2013-04-28 LAB — CBC
HCT: 33.1 % — ABNORMAL LOW (ref 36.0–46.0)
HEMOGLOBIN: 11.6 g/dL — AB (ref 12.0–15.0)
MCH: 33 pg (ref 26.0–34.0)
MCHC: 35 g/dL (ref 30.0–36.0)
MCV: 94.3 fL (ref 78.0–100.0)
Platelets: 143 10*3/uL — ABNORMAL LOW (ref 150–400)
RBC: 3.51 MIL/uL — ABNORMAL LOW (ref 3.87–5.11)
RDW: 13.4 % (ref 11.5–15.5)
WBC: 9.1 10*3/uL (ref 4.0–10.5)

## 2013-04-28 MED ORDER — IBUPROFEN 600 MG PO TABS
600.0000 mg | ORAL_TABLET | Freq: Four times a day (QID) | ORAL | Status: DC
  Administered 2013-04-28 – 2013-04-29 (×5): 600 mg via ORAL
  Filled 2013-04-28 (×5): qty 1

## 2013-04-28 MED ORDER — PRENATAL MULTIVITAMIN CH
1.0000 | ORAL_TABLET | Freq: Every day | ORAL | Status: DC
  Administered 2013-04-28: 1 via ORAL
  Filled 2013-04-28: qty 1

## 2013-04-28 MED ORDER — DIPHENHYDRAMINE HCL 25 MG PO CAPS: 25.0000 mg | ORAL_CAPSULE | Freq: Four times a day (QID) | ORAL | Status: DC | PRN

## 2013-04-28 MED ORDER — DIBUCAINE 1 % RE OINT: 1.0000 "application " | TOPICAL_OINTMENT | RECTAL | Status: DC | PRN

## 2013-04-28 MED ORDER — TETANUS-DIPHTH-ACELL PERTUSSIS 5-2.5-18.5 LF-MCG/0.5 IM SUSP: 0.5000 mL | Freq: Once | INTRAMUSCULAR | Status: DC

## 2013-04-28 MED ORDER — OXYCODONE-ACETAMINOPHEN 5-325 MG PO TABS: 1.0000 | ORAL_TABLET | ORAL | Status: DC | PRN

## 2013-04-28 MED ORDER — WITCH HAZEL-GLYCERIN EX PADS: 1.0000 "application " | MEDICATED_PAD | CUTANEOUS | Status: DC | PRN

## 2013-04-28 MED ORDER — BISACODYL 10 MG RE SUPP: 10.0000 mg | Freq: Every day | RECTAL | Status: DC | PRN

## 2013-04-28 MED ORDER — ONDANSETRON HCL 4 MG PO TABS: 4.0000 mg | ORAL_TABLET | ORAL | Status: DC | PRN

## 2013-04-28 MED ORDER — SIMETHICONE 80 MG PO CHEW: 80.0000 mg | CHEWABLE_TABLET | ORAL | Status: DC | PRN

## 2013-04-28 MED ORDER — BENZOCAINE-MENTHOL 20-0.5 % EX AERO
1.0000 "application " | INHALATION_SPRAY | CUTANEOUS | Status: DC | PRN
  Filled 2013-04-28: qty 56

## 2013-04-28 MED ORDER — ZOLPIDEM TARTRATE 5 MG PO TABS: 5.0000 mg | ORAL_TABLET | Freq: Every evening | ORAL | Status: DC | PRN

## 2013-04-28 MED ORDER — SENNOSIDES-DOCUSATE SODIUM 8.6-50 MG PO TABS
2.0000 | ORAL_TABLET | ORAL | Status: DC
  Administered 2013-04-29: 2 via ORAL
  Filled 2013-04-28: qty 2

## 2013-04-28 MED ORDER — LANOLIN HYDROUS EX OINT: TOPICAL_OINTMENT | CUTANEOUS | Status: DC | PRN

## 2013-04-28 MED ORDER — FLEET ENEMA 7-19 GM/118ML RE ENEM: 1.0000 | ENEMA | Freq: Every day | RECTAL | Status: DC | PRN

## 2013-04-28 MED ORDER — ONDANSETRON HCL 4 MG/2ML IJ SOLN: 4.0000 mg | INTRAMUSCULAR | Status: DC | PRN

## 2013-04-28 NOTE — Anesthesia Postprocedure Evaluation (Signed)
  Anesthesia Post-op Note  Patient: [name]  Procedure(s) Performed: * No procedures listed *  Patient Location: Mother/Baby  Anesthesia Type:Epidural  Level of Consciousness: awake  Airway and Oxygen Therapy: Patient Spontanous Breathing  Post-op Pain: none  Post-op Assessment: Patient's Cardiovascular Status Stable, Respiratory Function Stable, Patent Airway, No signs of Nausea or vomiting, Adequate PO intake, Pain level controlled, No headache, No backache, No residual numbness and No residual motor weakness  Post-op Vital Signs: Reviewed and stable  Complications: No apparent anesthesia complications

## 2013-04-28 NOTE — Progress Notes (Signed)
Post Partum Day  1 s/p SVD  Subjective: no complaints and tolerating PO  Objective: Blood pressure 118/76, pulse 75, temperature 98.3 F (36.8 C), temperature source Oral, resp. rate 20, height 5\' 3"  (1.6 m), weight 72.576 kg (160 lb), last menstrual period 07/23/2012, SpO2 99.00%, unknown if currently breastfeeding.  Physical Exam:  General: alert and cooperative Lochia: appropriate Uterine Fundus: firm Incision: NA DVT Evaluation: No evidence of DVT seen on physical exam.   Recent Labs  04/27/13 1330 04/28/13 0612  HGB 12.5 11.6*  HCT 35.3* 33.1*    Assessment/Plan: Plan for discharge tomorrow and Breastfeeding   LOS: 1 day   Corina Stacy J. 04/28/2013, 5:18 PM

## 2013-04-29 MED ORDER — IBUPROFEN 600 MG PO TABS: 600.0000 mg | ORAL_TABLET | Freq: Four times a day (QID) | ORAL | Status: DC | PRN

## 2013-04-29 NOTE — Lactation Note (Signed)
This note was copied from the chart of [name]. Lactation Consultation Note  Patient Name: Connie Memory [name] UJWJX'BToday's Date: 04/29/2013 Reason for consult: Follow-up assessment Baby at the breast when I arrived demonstrating a good rhythmic suck with swallowing motions. Mom denies questions or concerns. She does report some mild nipple tenderness, care for sore nipples reviewed. Mom has comfort gels. Reviewed importance of deep latch. Engorgement care reviewed if needed. Advised of OP services and support group.   Maternal Data    Feeding Feeding Type: Breast Fed Length of feed: 20 min  LATCH Score/Interventions Latch: Grasps breast easily, tongue down, lips flanged, rhythmical sucking.  Audible Swallowing: A few with stimulation  Type of Nipple: Everted at rest and after stimulation  Comfort (Breast/Nipple): Soft / non-tender     Hold (Positioning): No assistance needed to correctly position infant at breast.  LATCH Score: 9  Lactation Tools Discussed/Used     Consult Status Consult Status: Complete Date: 04/29/13 Follow-up type: In-patient    Alfred LevinsGranger, Juandedios Dudash Ann 04/29/2013, 10:59 AM

## 2013-04-29 NOTE — Discharge Summary (Signed)
Obstetric Discharge Summary Reason for Admission: onset of labor Prenatal Procedures: none Intrapartum Procedures: spontaneous vaginal delivery Postpartum Procedures: none Complications-Operative and Postpartum: 2nd degree perineal laceration Hemoglobin  Date Value Ref Range Status  04/28/2013 11.6* 12.0 - 15.0 g/dL Final     HCT  Date Value Ref Range Status  04/28/2013 33.1* 36.0 - 46.0 % Final    Physical Exam:  General: alert and cooperative Lochia: appropriate Uterine Fundus: firm Incision: NA DVT Evaluation: No evidence of DVT seen on physical exam.  Discharge Diagnoses: Term Pregnancy-delivered  Discharge Information: Date: 04/29/2013 Activity: pelvic rest Diet: routine Medications: PNV and Ibuprofen Condition: stable Instructions: refer to practice specific booklet Discharge to: home Follow-up Information   Follow up with Jessee AversOLE,Giulianna Rocha J., MD. Schedule an appointment as soon as possible for a visit in 6 weeks. ( pt may already have an appointment )    Specialty:  Obstetrics and Gynecology   Contact information:   [address] [place] [address] [phone]       Newborn Data: Live born female  Birth Weight: 7 lb 11.5 oz (3501 g) APGAR: 9, 9  Home with mother.  Taylon Coole J. 04/29/2013, 8:26 AM

## 2013-12-21 ENCOUNTER — Encounter (HOSPITAL_COMMUNITY): Payer: Self-pay | Admitting: *Deleted

## 2014-12-28 LAB — OB RESULTS CONSOLE RPR: RPR: NONREACTIVE

## 2014-12-28 LAB — OB RESULTS CONSOLE ABO/RH: RH Type: POSITIVE

## 2014-12-28 LAB — OB RESULTS CONSOLE ANTIBODY SCREEN: ANTIBODY SCREEN: NEGATIVE

## 2014-12-28 LAB — OB RESULTS CONSOLE HIV ANTIBODY (ROUTINE TESTING): HIV: NONREACTIVE

## 2014-12-28 LAB — OB RESULTS CONSOLE HEPATITIS B SURFACE ANTIGEN: Hepatitis B Surface Ag: NEGATIVE

## 2014-12-28 LAB — OB RESULTS CONSOLE RUBELLA ANTIBODY, IGM: Rubella: IMMUNE

## 2015-01-21 ENCOUNTER — Other Ambulatory Visit: Payer: Self-pay | Admitting: Obstetrics & Gynecology

## 2015-01-21 ENCOUNTER — Other Ambulatory Visit (HOSPITAL_COMMUNITY)
Admission: RE | Admit: 2015-01-21 | Discharge: 2015-01-21 | Disposition: A | Discharge status: Home / Self Care | Payer: 59 | Source: Ambulatory Visit | Attending: Obstetrics & Gynecology | Admitting: Obstetrics & Gynecology

## 2015-01-21 DIAGNOSIS — Z1151 Encounter for screening for human papillomavirus (HPV): Secondary | ICD-10-CM | POA: Diagnosis present

## 2015-01-21 DIAGNOSIS — Z113 Encounter for screening for infections with a predominantly sexual mode of transmission: Secondary | ICD-10-CM | POA: Diagnosis present

## 2015-01-21 DIAGNOSIS — Z01419 Encounter for gynecological examination (general) (routine) without abnormal findings: Secondary | ICD-10-CM | POA: Insufficient documentation

## 2015-01-24 LAB — CYTOLOGY - PAP

## 2015-02-20 NOTE — L&D Delivery Note (Signed)
Pt progressed to C/C/+2 on only 3 milliunits of Pitocin, and in spite of ctxs never becoming adequate. FHRT remained reassuring throughout 2nd stage with non persistent moderate variables and rare late noted during both 1st and 2nd stages. This was likely due to rapid dilatation and descent. Intrauterine resuscitative measures remained in effect until delivery of baby.  Delivery Note At 5:28 AM a viable female was delivered via Vaginal, Spontaneous Delivery (Presentation: OA restituting to Left Occiput Anterior).  APGARS: 9, 9; weight 7 lb 11.1 oz (3490 g).   Placenta status: Intact, Spontaneous Schultz.  Cord: 3 vessels with the following complications: None.  Cord pH: NA  Anesthesia: Epidural  Episiotomy: None Lacerations: 2nd degree;Perineal Suture Repair: 3.0 chromic CT and SH Est. Blood Loss (mL): 300  Mom to postpartum.  Baby to Couplet care / Skin to Skin.  Mom plans to breastfeed.  She will discuss birth control options w/ Dr. Charlotta Newtonzan.  Sherre ScarletWILLIAMS, Taurean Ju 08/17/2015, 8 AM

## 2015-08-16 ENCOUNTER — Inpatient Hospital Stay (HOSPITAL_COMMUNITY): Payer: Managed Care, Other (non HMO) | Admitting: Anesthesiology

## 2015-08-16 ENCOUNTER — Encounter (HOSPITAL_COMMUNITY): Payer: Self-pay

## 2015-08-16 ENCOUNTER — Inpatient Hospital Stay (HOSPITAL_COMMUNITY)
Admission: AD | Admit: 2015-08-16 | Discharge: 2015-08-18 | DRG: 775 | Disposition: A | Discharge status: Home / Self Care | Payer: Managed Care, Other (non HMO) | Source: Ambulatory Visit | Attending: Obstetrics and Gynecology | Admitting: Obstetrics and Gynecology

## 2015-08-16 DIAGNOSIS — O9912 Other diseases of the blood and blood-forming organs and certain disorders involving the immune mechanism complicating childbirth: Secondary | ICD-10-CM | POA: Diagnosis present

## 2015-08-16 DIAGNOSIS — Z3A40 40 weeks gestation of pregnancy: Secondary | ICD-10-CM | POA: Diagnosis not present

## 2015-08-16 DIAGNOSIS — D6959 Other secondary thrombocytopenia: Secondary | ICD-10-CM | POA: Diagnosis present

## 2015-08-16 LAB — CBC
HEMATOCRIT: 39 % (ref 36.0–46.0)
Hemoglobin: 13.8 g/dL (ref 12.0–15.0)
MCH: 33.6 pg (ref 26.0–34.0)
MCHC: 35.4 g/dL (ref 30.0–36.0)
MCV: 94.9 fL (ref 78.0–100.0)
Platelets: 147 10*3/uL — ABNORMAL LOW (ref 150–400)
RBC: 4.11 MIL/uL (ref 3.87–5.11)
RDW: 13.4 % (ref 11.5–15.5)
WBC: 6.9 10*3/uL (ref 4.0–10.5)

## 2015-08-16 LAB — URINE MICROSCOPIC-ADD ON

## 2015-08-16 LAB — OB RESULTS CONSOLE GBS: GBS: NEGATIVE

## 2015-08-16 LAB — URINALYSIS, ROUTINE W REFLEX MICROSCOPIC
BILIRUBIN URINE: NEGATIVE
GLUCOSE, UA: NEGATIVE mg/dL
KETONES UR: 15 mg/dL — AB
Leukocytes, UA: NEGATIVE
NITRITE: NEGATIVE
PH: 6 (ref 5.0–8.0)
Protein, ur: NEGATIVE mg/dL
Specific Gravity, Urine: 1.02 (ref 1.005–1.030)

## 2015-08-16 LAB — TYPE AND SCREEN
ABO/RH(D): A POS
ANTIBODY SCREEN: NEGATIVE

## 2015-08-16 MED ORDER — PHENYLEPHRINE 40 MCG/ML (10ML) SYRINGE FOR IV PUSH (FOR BLOOD PRESSURE SUPPORT)
80.0000 ug | PREFILLED_SYRINGE | INTRAVENOUS | Status: DC | PRN
  Filled 2015-08-16: qty 10
  Filled 2015-08-16: qty 5

## 2015-08-16 MED ORDER — LIDOCAINE HCL (PF) 1 % IJ SOLN
INTRAMUSCULAR | Status: DC | PRN
  Administered 2015-08-16 (×2): 4 mL

## 2015-08-16 MED ORDER — EPHEDRINE 5 MG/ML INJ
10.0000 mg | INTRAVENOUS | Status: DC | PRN
  Filled 2015-08-16: qty 2

## 2015-08-16 MED ORDER — ACETAMINOPHEN 325 MG PO TABS: 650.0000 mg | ORAL_TABLET | ORAL | Status: DC | PRN

## 2015-08-16 MED ORDER — OXYTOCIN BOLUS FROM INFUSION
500.0000 mL | INTRAVENOUS | Status: DC
  Administered 2015-08-17: 500 mL via INTRAVENOUS

## 2015-08-16 MED ORDER — PHENYLEPHRINE 40 MCG/ML (10ML) SYRINGE FOR IV PUSH (FOR BLOOD PRESSURE SUPPORT)
80.0000 ug | PREFILLED_SYRINGE | INTRAVENOUS | Status: DC | PRN
  Administered 2015-08-16 (×2): 40 ug via INTRAVENOUS
  Filled 2015-08-16: qty 5

## 2015-08-16 MED ORDER — PENICILLIN G POTASSIUM 5000000 UNITS IJ SOLR
5.0000 10*6.[IU] | Freq: Once | INTRAMUSCULAR | Status: AC
  Administered 2015-08-16: 5 10*6.[IU] via INTRAVENOUS
  Filled 2015-08-16: qty 5

## 2015-08-16 MED ORDER — FLEET ENEMA 7-19 GM/118ML RE ENEM: 1.0000 | ENEMA | RECTAL | Status: DC | PRN

## 2015-08-16 MED ORDER — LACTATED RINGERS IV SOLN: 500.0000 mL | INTRAVENOUS | Status: DC | PRN

## 2015-08-16 MED ORDER — DIPHENHYDRAMINE HCL 50 MG/ML IJ SOLN: 12.5000 mg | INTRAMUSCULAR | Status: DC | PRN

## 2015-08-16 MED ORDER — LACTATED RINGERS IV SOLN: 500.0000 mL | Freq: Once | INTRAVENOUS | Status: DC

## 2015-08-16 MED ORDER — LACTATED RINGERS IV SOLN
INTRAVENOUS | Status: DC
Start: 2015-08-16 — End: 2015-08-17
  Administered 2015-08-16: 21:00:00 via INTRAVENOUS

## 2015-08-16 MED ORDER — FENTANYL CITRATE (PF) 100 MCG/2ML IJ SOLN: 50.0000 ug | INTRAMUSCULAR | Status: DC | PRN

## 2015-08-16 MED ORDER — LIDOCAINE HCL (PF) 1 % IJ SOLN
30.0000 mL | INTRAMUSCULAR | Status: DC | PRN
  Filled 2015-08-16: qty 30

## 2015-08-16 MED ORDER — FENTANYL 2.5 MCG/ML BUPIVACAINE 1/10 % EPIDURAL INFUSION (WH - ANES)
14.0000 mL/h | INTRAMUSCULAR | Status: DC | PRN
  Administered 2015-08-16 – 2015-08-17 (×3): 14 mL/h via EPIDURAL
  Filled 2015-08-16 (×2): qty 125

## 2015-08-16 MED ORDER — ONDANSETRON HCL 4 MG/2ML IJ SOLN: 4.0000 mg | Freq: Four times a day (QID) | INTRAMUSCULAR | Status: DC | PRN

## 2015-08-16 MED ORDER — OXYTOCIN 40 UNITS IN LACTATED RINGERS INFUSION - SIMPLE MED: 2.5000 [IU]/h | INTRAVENOUS | Status: DC

## 2015-08-16 MED ORDER — OXYCODONE-ACETAMINOPHEN 5-325 MG PO TABS: 2.0000 | ORAL_TABLET | ORAL | Status: DC | PRN

## 2015-08-16 MED ORDER — OXYCODONE-ACETAMINOPHEN 5-325 MG PO TABS: 1.0000 | ORAL_TABLET | ORAL | Status: DC | PRN

## 2015-08-16 MED ORDER — DEXTROSE 5 % IV SOLN
2.5000 10*6.[IU] | INTRAVENOUS | Status: DC
  Filled 2015-08-16: qty 2.5

## 2015-08-16 MED ORDER — SOD CITRATE-CITRIC ACID 500-334 MG/5ML PO SOLN: 30.0000 mL | ORAL | Status: DC | PRN

## 2015-08-16 NOTE — H&P (Signed)
[name] is a 30 y.o. female, G3P2002 @ 40.0 wks estimated gestational age (as dated by LMP c/w 18.2 week ultrasound) presents complaining of contractions since 6 AM today, becoming progressively worse by 4 pm.+FM. No Leaking of Fluid or Vaginal Bleeding. Prenatal care provided by [facility].    Antepartum course uncomplicated.  History OB History    Gravida Para Term Preterm AB TAB SAB Ectopic Multiple Living   3 2 2  0 0 0 0 0 0 2     SVB on 08/15/2011 @ 41 wks, 18-hr labor, female infant, birthwt 6+12, Epidural, [facility] SVB on 04/27/2013 @ 39 wks, 12-hr labor, female infant, birthwt 7+11, Epidural[facility] Dr. Charlot[facility] Newtonzan   Past Medical History  Diagnosis Date  . ASD (atrial septal defect)     age 89  . H/O varicella   . ZOXWRUEA(540.9Headache(784.0)    Past Surgical History  Procedure Laterality Date  . Asd repair  1992   Family History: family history is not on file. Social History:  reports that she has never smoked. She has never used smokeless tobacco. She reports that she does not drink alcohol or use illicit drugs.   Prenatal Transfer Tool  Maternal Diabetes: No Genetic Screening: Declined Maternal Ultrasounds/Referrals: Normal Fetal Ultrasounds or other Referrals:  None Maternal Substance Abuse:  No Significant Maternal Medications:  Meds include: Other: PNV, Tyl prn Significant Maternal Lab Results:  Lab values include: Group B Strep negative 08/16/15 Other Comments: GC/CT& pap neg  ROS 10 Systems reviewed and are negative for acute change except as noted in the HPI.    Dilation: 4 Effacement (%): 50 Station: -2 Exam by:: a. thacker rn@ 22:14 Blood pressure 98/61, pulse 62, temperature 98.1 F (36.7 C), temperature source Oral, resp. rate 18, height 5\' 3"  (1.6 m), weight 74.39 kg (164 lb), last menstrual period 11/09/2014, SpO2 99 %, unknown if currently breastfeeding.  Cephalic by Leopold's   Maternal Exam:  Uterine Assessment:  Contraction strength is moderate.  Contraction duration is 30 seconds. Contraction frequency is regular.   Abdomen: Patient reports no abdominal tenderness. Fundal height is CWD.   Estimated fetal weight is 7lbs 7 oz.    Introitus: Normal vulva. Normal vagina.  Pelvis: adequate for delivery.   Cervix: Cervix evaluated by digital exam.     Fetal Exam Fetal Monitor Review: Baseline rate: 150 bpm.  Variability: moderate (6-25 bpm).   Pattern: no decelerations and accelerations present.    Fetal State Assessment: Category I - tracings are normal.     Physical Exam   Constitutional: She is oriented to person, place, and time. She appears well-developed and well-nourished.  Head: Normocephalic and atraumatic.  Neck: Normal range of motion.  Cardiovascular: Normal rate, regular rhythm and normal heart sounds.  Respiratory: Effort normal and breath sounds normal.  GI: Soft. Bowel sounds are normal. Abdomen is gravid.  Neurological: She is alert and oriented to person, place, and time.  Skin: Skin is warm and dry.  Psychiatric: She has a normal mood and affect. Her behavior is normal.   Prenatal labs: ABO, Rh: A positive Antibody: Neg Rubella: Immune RPR: NR HBsAg: Neg HIV: Neg GBS: Unable to locate results in records. Will collect. If neg, will d/c antibiotics ----- NEG (08/16/15)  Results for orders placed or performed during the hospital encounter of 08/16/15 (from the past 24 hour(s))  Urinalysis, Routine w reflex microscopic (not [facility])     Status: Abnormal  Collection Time: 08/16/15  7:03 PM  Result Value Ref Range   Color, Urine YELLOW YELLOW   APPearance CLEAR CLEAR   Specific Gravity, Urine 1.020 1.005 - 1.030   pH 6.0 5.0 - 8.0   Glucose, UA NEGATIVE NEGATIVE mg/dL   Hgb urine dipstick TRACE (A) NEGATIVE   Bilirubin Urine NEGATIVE NEGATIVE   Ketones, ur 15 (A) NEGATIVE mg/dL   Protein, ur NEGATIVE NEGATIVE mg/dL   Nitrite NEGATIVE NEGATIVE   Leukocytes, UA  NEGATIVE NEGATIVE  Urine microscopic-add on     Status: Abnormal   Collection Time: 08/16/15  7:03 PM  Result Value Ref Range   Squamous Epithelial / LPF 0-5 (A) NONE SEEN   WBC, UA 0-5 0 - 5 WBC/hpf   RBC / HPF 0-5 0 - 5 RBC/hpf   Bacteria, UA FEW (A) NONE SEEN  CBC     Status: Abnormal   Collection Time: 08/16/15  8:31 PM  Result Value Ref Range   WBC 6.9 4.0 - 10.5 K/uL   RBC 4.11 3.87 - 5.11 MIL/uL   Hemoglobin 13.8 12.0 - 15.0 g/dL   HCT 16.139.0 09.636.0 - 04.546.0 %   MCV 94.9 78.0 - 100.0 fL   MCH 33.6 26.0 - 34.0 pg   MCHC 35.4 30.0 - 36.0 g/dL   RDW 40.913.4 81.111.5 - 91.415.5 %   Platelets 147 (L) 150 - 400 K/uL  Type and screen [facility]     Status: None   Collection Time: 08/16/15  8:31 PM  Result Value Ref Range   ABO/RH(D) A POS    Antibody Screen NEG    Sample Expiration 08/19/2015   Group B strep by PCR     Status: None   Collection Time: 08/16/15  8:45 PM  Result Value Ref Range   Group B strep by PCR NEGATIVE NEGATIVE  OB RESULT CONSOLE Group B Strep     Status: None   Collection Time: 08/16/15  8:45 PM  Result Value Ref Range   GBS Negative     Assessment/Plan: 29 y.o.G3P2002 @ 5768w0d who presents in latent labor. -FWB- Cat 1. -Labor: will monitor her contractions, if less than q3-905min, will start Pit per protocol. -Pain: epidural upon request. -AROM when comfortable w/ epidural. -Expectant management for now. -CBC, T&S and GBS via PCR to be collected. -Elevated BP noted, suspect due to pain, will repeat BP and if still elevated plan to rule out preeclampsia. Pt currently asymptomatic. -GBS unknown. If neg results, will discontinue antibiotics. -Mild gestational thrombocytopenia.    Sherre ScarletWILLIAMS, Chistopher Mangino 08/16/2015, 7:45 PM  ADDENDUM:  GBS neg - antibiotics d/c'd   Sherre ScarletKimberly Mycala Warshawsky, CNM 08/16/15, 8:55 PM

## 2015-08-16 NOTE — Anesthesia Procedure Notes (Signed)
Epidural Patient location during procedure: OB  Staffing Anesthesiologist: Kayleann Mccaffery Performed by: anesthesiologist   Preanesthetic Checklist Completed: patient identified, pre-op evaluation, timeout performed, IV checked, risks and benefits discussed and monitors and equipment checked  Epidural Patient position: sitting Prep: site prepped and draped and DuraPrep Patient monitoring: heart rate Approach: midline Location: L3-L4 Injection technique: LOR air and LOR saline  Needle:  Needle type: Tuohy  Needle gauge: 17 G Needle length: 9 cm Needle insertion depth: 7 cm Catheter type: closed end flexible Catheter size: 19 Gauge Catheter at skin depth: 13 cm Test dose: negative  Assessment Sensory level: T8 Events: blood not aspirated, injection not painful, no injection resistance, negative IV test and no paresthesia  Additional Notes Reason for block:procedure for pain   

## 2015-08-16 NOTE — Anesthesia Preprocedure Evaluation (Signed)
Anesthesia Evaluation  Patient identified by MRN, date of birth, ID band Patient awake    Reviewed: Allergy & Precautions, H&P , NPO status , Patient's Chart, lab work & pertinent test results, reviewed documented beta blocker date and time   History of Anesthesia Complications Negative for: history of anesthetic complications  Airway Mallampati: III  TM Distance: >3 FB Neck ROM: full    Dental  (+) Teeth Intact   Pulmonary neg pulmonary ROS,    breath sounds clear to auscultation       Cardiovascular  Rhythm:regular Rate:Normal  H/o ASD repair at 30 yo, released from cardiology follow-up at 30 yo   Neuro/Psych negative neurological ROS  negative psych ROS   GI/Hepatic negative GI ROS, Neg liver ROS,   Endo/Other  negative endocrine ROS  Renal/GU negative Renal ROS     Musculoskeletal   Abdominal   Peds  Hematology negative hematology ROS (+)   Anesthesia Other Findings   Reproductive/Obstetrics (+) Pregnancy                             Anesthesia Physical  Anesthesia Plan  ASA: II  Anesthesia Plan: Epidural   Post-op Pain Management:    Induction:   Airway Management Planned:   Additional Equipment:   Intra-op Plan:   Post-operative Plan:   Informed Consent: I have reviewed the patients History and Physical, chart, labs and discussed the procedure including the risks, benefits and alternatives for the proposed anesthesia with the patient or authorized representative who has indicated his/her understanding and acceptance.     Plan Discussed with:   Anesthesia Plan Comments:         Anesthesia Quick Evaluation

## 2015-08-16 NOTE — Progress Notes (Addendum)
Spouse at bedside.  Subjective: Comfortable w/ epidural. +FM. Denies VB or LOF.  Objective: BP 102/55 mmHg  Pulse 64  Temp(Src) 98.1 F (36.7 C) (Oral)  Resp 18  Ht 5\' 3"  (1.6 m)  Wt 74.39 kg (164 lb)  BMI 29.06 kg/m2  SpO2 99%  LMP 11/09/2014 (Exact Date) Today's Vitals   08/16/15 2214 08/16/15 2216 08/16/15 2231 08/16/15 2301  BP:   98/61 102/55  Pulse: 74 74 62 64  Temp:      TempSrc:      Resp:   18 18  Height:      Weight:      SpO2: 99% 99%    PainSc:   0-No pain 0-No pain    FHT: BL 150 w/ moderate variability, +accels, occ variables & earlys -- had a couple of late decels s/p epidural, BP down to 70-80/40-60 - BP and FHR resolved s/p Phenylephrine 40 mcg IV  UC:   irregular, every 2-4 minutes SVE:   Dilation: 4 Effacement (%): 50 Station: -2 Exam by:: kim Evarose Bates cnm@ 23:25 PM  AROM'd at 2323, moderate amount of clear fluid noted  IUPC placed w/o difficulty  Assessment:  IUP at 40 wks Latent labor Overall reassuring FHRT GBS neg  Plan: Consider Pitocin if ctxs become inadequate. Risks and benefits reviewed with pt and spouse, including failure of method, prolonged labor, need for further intervention, and risk of cesarean. Desire to proceed if necessary.  Expect progress and SVD.  [name], Connie Thivierge CNM 08/16/2015, 11:27 PM

## 2015-08-16 NOTE — Anesthesia Pain Management Evaluation Note (Signed)
  CRNA Pain Management Visit Note  Patient: [name], 30 y.o., female  "Hello I am a member of the anesthesia team at [facility]. We have an anesthesia team available at all times to provide care throughout the hospital, including epidural management and anesthesia for C-section. I don't know your plan for the delivery whether it a natural birth, water birth, IV sedation, nitrous supplementation, doula or epidural, but we want to meet your pain goals."   1.Was your pain managed to your expectations on prior hospitalizations?   Yes   2.What is your expectation for pain management during this hospitalization?     Epidural  3.How can we help you reach that goal? Epidural   Record the patient's initial score and the patient's pain goal.   Pain: 8  Pain Goal: 5 [facility] wants you to be able to say your pain was always managed very well.  Layn Kye 08/16/2015

## 2015-08-16 NOTE — MAU Note (Signed)
Contractions started at 6am, started getting consistent at 4pm, every 5 minutes

## 2015-08-17 ENCOUNTER — Encounter (HOSPITAL_COMMUNITY): Payer: Self-pay

## 2015-08-17 LAB — ABO/RH: ABO/RH(D): A POS

## 2015-08-17 LAB — GROUP B STREP BY PCR: Group B strep by PCR: NEGATIVE

## 2015-08-17 LAB — RPR: RPR: NONREACTIVE

## 2015-08-17 MED ORDER — COCONUT OIL OIL: 1.0000 "application " | TOPICAL_OIL | Status: DC | PRN

## 2015-08-17 MED ORDER — SENNOSIDES-DOCUSATE SODIUM 8.6-50 MG PO TABS
2.0000 | ORAL_TABLET | ORAL | Status: DC
  Administered 2015-08-17: 2 via ORAL
  Filled 2015-08-17: qty 2

## 2015-08-17 MED ORDER — OXYTOCIN 40 UNITS IN LACTATED RINGERS INFUSION - SIMPLE MED
1.0000 m[IU]/min | INTRAVENOUS | Status: DC
  Administered 2015-08-17: 1 m[IU]/min via INTRAVENOUS
  Filled 2015-08-17: qty 1000

## 2015-08-17 MED ORDER — TERBUTALINE SULFATE 1 MG/ML IJ SOLN
0.2500 mg | Freq: Once | INTRAMUSCULAR | Status: DC | PRN
  Filled 2015-08-17: qty 1

## 2015-08-17 MED ORDER — BENZOCAINE-MENTHOL 20-0.5 % EX AERO
1.0000 "application " | INHALATION_SPRAY | CUTANEOUS | Status: DC | PRN
  Administered 2015-08-17: 1 via TOPICAL
  Filled 2015-08-17: qty 56

## 2015-08-17 MED ORDER — DIBUCAINE 1 % RE OINT: 1.0000 "application " | TOPICAL_OINTMENT | RECTAL | Status: DC | PRN

## 2015-08-17 MED ORDER — PRENATAL MULTIVITAMIN CH
1.0000 | ORAL_TABLET | Freq: Every day | ORAL | Status: DC
  Administered 2015-08-17: 1 via ORAL
  Filled 2015-08-17: qty 1

## 2015-08-17 MED ORDER — SIMETHICONE 80 MG PO CHEW: 80.0000 mg | CHEWABLE_TABLET | ORAL | Status: DC | PRN

## 2015-08-17 MED ORDER — DIPHENHYDRAMINE HCL 25 MG PO CAPS: 25.0000 mg | ORAL_CAPSULE | Freq: Four times a day (QID) | ORAL | Status: DC | PRN

## 2015-08-17 MED ORDER — IBUPROFEN 600 MG PO TABS
600.0000 mg | ORAL_TABLET | Freq: Four times a day (QID) | ORAL | Status: DC
  Administered 2015-08-17 – 2015-08-18 (×4): 600 mg via ORAL
  Filled 2015-08-17 (×4): qty 1

## 2015-08-17 MED ORDER — ONDANSETRON HCL 4 MG/2ML IJ SOLN: 4.0000 mg | INTRAMUSCULAR | Status: DC | PRN

## 2015-08-17 MED ORDER — ACETAMINOPHEN 325 MG PO TABS
650.0000 mg | ORAL_TABLET | ORAL | Status: DC | PRN
  Administered 2015-08-17 – 2015-08-18 (×3): 650 mg via ORAL
  Filled 2015-08-17 (×4): qty 2

## 2015-08-17 MED ORDER — ONDANSETRON HCL 4 MG PO TABS: 4.0000 mg | ORAL_TABLET | ORAL | Status: DC | PRN

## 2015-08-17 MED ORDER — ZOLPIDEM TARTRATE 5 MG PO TABS: 5.0000 mg | ORAL_TABLET | Freq: Every evening | ORAL | Status: DC | PRN

## 2015-08-17 MED ORDER — WITCH HAZEL-GLYCERIN EX PADS: 1.0000 "application " | MEDICATED_PAD | CUTANEOUS | Status: DC | PRN

## 2015-08-17 NOTE — Anesthesia Postprocedure Evaluation (Signed)
Anesthesia Post Note  Patient: [name]  Procedure(s) Performed: * No procedures listed *  Patient location during evaluation: Mother Baby Anesthesia Type: Epidural Level of consciousness: awake and alert and oriented Pain management: satisfactory to patient Vital Signs Assessment: post-procedure vital signs reviewed and stable Respiratory status: spontaneous breathing and nonlabored ventilation Cardiovascular status: stable Postop Assessment: no headache, no backache, no signs of nausea or vomiting, adequate PO intake and patient able to bend at knees (patient up walking) Anesthetic complications: no     Last Vitals:  Filed Vitals:   08/17/15 0840 08/17/15 0940  BP: 112/62 110/75  Pulse: 64 83  Temp: 36.9 C 36.7 C  Resp: 18 18    Last Pain:  Filed Vitals:   08/17/15 1157  PainSc: 0-No pain   Pain Goal:                 Swan [place]fax

## 2015-08-17 NOTE — Lactation Note (Signed)
This note was copied from a baby's chart. Lactation Consultation Note  Patient Name: Girl Memory DanceKatelyn Barreira BJYNW'GToday's Date: 08/17/2015 Reason for consult: Initial assessment Baby at 10 hr of life. Experienced bf mom reports latching is well. She denies breast or nipple pain, voiced no concerns. Discussed baby behavior, feeding frequency, voids, wt loss, breast changes, and nipple care. She stated she can manually express and has a spoon in the room. Given lactation handouts. Aware of OP services and support group.    Maternal Data Has patient been taught Hand Expression?: Yes Does the patient have breastfeeding experience prior to this delivery?: Yes  Feeding    LATCH Score/Interventions                      Lactation Tools Discussed/Used WIC Program: No   Consult Status Consult Status: Follow-up Date: 08/18/15 Follow-up type: In-patient    Rulon Eisenmengerlizabeth E Amahia Madonia 08/17/2015, 4:02 PM

## 2015-08-18 LAB — CBC
HEMATOCRIT: 32.6 % — AB (ref 36.0–46.0)
HEMOGLOBIN: 11.1 g/dL — AB (ref 12.0–15.0)
MCH: 32.8 pg (ref 26.0–34.0)
MCHC: 34 g/dL (ref 30.0–36.0)
MCV: 96.4 fL (ref 78.0–100.0)
PLATELETS: 129 10*3/uL — AB (ref 150–400)
RBC: 3.38 MIL/uL — ABNORMAL LOW (ref 3.87–5.11)
RDW: 13.8 % (ref 11.5–15.5)
WBC: 5.5 10*3/uL (ref 4.0–10.5)

## 2015-08-18 MED ORDER — IBUPROFEN 600 MG PO TABS: 600.0000 mg | ORAL_TABLET | Freq: Four times a day (QID) | ORAL | Status: DC | PRN

## 2015-08-18 NOTE — Discharge Instructions (Signed)

## 2015-08-18 NOTE — Progress Notes (Signed)
Postpartum Note Day # 1  S:  Patient resting comfortable in bed.  Pain controlled.  Tolerating general diet. + flatus, + BM.  Lochia moderate.  Ambulating without difficulty.  She denies n/v/f/c, SOB, or CP.  Pt plans on breastfeeding.  O: Temp:  [97.9 F (36.6 C)-98.6 F (37 C)] 97.9 F (36.6 C) (06/29 0600) Pulse Rate:  [53-83] 60 (06/29 0600) Resp:  [18] 18 (06/29 0600) BP: (99-122)/(58-75) 99/58 mmHg (06/29 0600)   Gen: A&Ox3, NAD CV: RRR, no MRG Resp: CTAB Abdomen: soft, NT, ND Uterus: firm, non-tender, below umbilicus Incision: c/d/i, bandage on Ext: No edema, no calf tenderness bilaterally  Labs:  Recent Labs  08/16/15 2031 08/18/15 0517  HGB 13.8 11.1*    A/P: Pt is a 30 y.o. [id number] s/p NSVD, PPD#1  - Pain well controlled -GU: UOP is adequate -GI: Tolerating general diet -Activity: encouraged sitting up to chair and ambulation as tolerated -Prophylaxis: early ambulation -Labs: stable as above  Meeting postpartum milestones appropriately, plan for discharge home today.  Myna HidalgoJennifer Jonae Renshaw, DO [phone] (pager) [phone] (office)

## 2015-08-19 NOTE — Discharge Summary (Signed)
OB Discharge Summary     Patient Name: [name] DOB: January 23, 1986 MRN: [id number]  Date of admission: 08/16/2015 Delivering MD: Sherre ScarletWILLIAMS, KIMBERLY   Date of discharge: 08/18/2015  Admitting diagnosis: 40w ctx 5 min apart Intrauterine pregnancy: 7036w1d     Secondary diagnosis:  Principal Problem:   Vaginal delivery Active Problems:   Normal labor  Additional problems: none     Discharge diagnosis: Term Pregnancy Delivered                                                                                                Post partum procedures:none  Augmentation: AROM  Complications: None  Hospital course:  Onset of Labor With Vaginal Delivery     30 y.o. yo [id number] at 6536w1d was admitted in Latent Labor on 08/16/2015. Patient had an uncomplicated labor course as follows:  Membrane Rupture Time/Date: 11:23 PM ,08/16/2015   Intrapartum Procedures: Episiotomy: None [1]                                         Lacerations:  2nd degree [3];Perineal [11]  Patient had a delivery of a Viable infant. 08/17/2015  Information for the patient's newborn:  [name], [name] [[id number]][[id number]]  Delivery Method: Vaginal, Spontaneous Delivery (Filed from Delivery Summary)    Pateint had an uncomplicated postpartum course.  She is ambulating, tolerating a regular diet, passing flatus, and urinating well. Patient is discharged home in stable condition on 08/18/2015.    Physical exam  Filed Vitals:   08/17/15 0940 08/17/15 1415 08/17/15 2149 08/18/15 0600  BP: 110/75 112/68 117/68 99/58  Pulse: 83 76 67 60  Temp: 98.1 F (36.7 C) 98.2 F (36.8 C) 97.9 F (36.6 C) 97.9 F (36.6 C)  TempSrc: Oral Oral Oral Oral  Resp: 18 18 18 18   Height:      Weight:      SpO2:       General: alert, cooperative and no distress Lochia: appropriate Uterine Fundus: firm Incision: N/A DVT Evaluation: No evidence of DVT seen on physical exam. Labs: Lab Results  Component Value Date   WBC 5.5 08/18/2015    HGB 11.1* 08/18/2015   HCT 32.6* 08/18/2015   MCV 96.4 08/18/2015   PLT 129* 08/18/2015   No flowsheet data found.  Discharge instruction: per After Visit Summary and "Baby and Me Booklet".  After visit meds:    Medication List    TAKE these medications        acetaminophen 325 MG tablet  Commonly known as:  TYLENOL  Take 325 mg by mouth every 6 (six) hours as needed for headache. Reported on 08/16/2015     ibuprofen 600 MG tablet  Commonly known as:  ADVIL,MOTRIN  Take 1 tablet (600 mg total) by mouth every 6 (six) hours as needed for moderate pain.     prenatal multivitamin Tabs tablet  Take 1 tablet by mouth daily.        Diet: routine diet  Activity: Advance  as tolerated. Pelvic rest for 6 weeks.   Outpatient follow up:6 weeks Follow up Appt:No future appointments. Follow up Visit:No Follow-up on file.  Postpartum contraception: IUD Mirena  Newborn Data: Live born female  Birth Weight: 7 lb 11.1 oz (3490 g) APGAR: 9, 9  Baby Feeding: Breast Disposition:home with mother   08/19/2015 Myna HidalgoZAN, Connie Hult, M, DO

## 2017-02-19 NOTE — L&D Delivery Note (Signed)
Delivery Note At 7:42 PM a viable female was delivered via  (Presentation OA ).  APGAR: 8,9 weight pending .   Placenta status: L&D Cord:  with the following complications: none.  Cord pH: n/a  Anesthesia:  epidural Episiotomy: None Lacerations: 1st degree Suture Repair: 3.0 vicryl Est. Blood Loss (mL): 350  Mom to postpartum.  Baby to Couplet care / Skin to Skin.  Sharon SellerJennifer M Carlyn Lemke 11/11/2017, 8:23 PM

## 2017-03-25 LAB — OB RESULTS CONSOLE HIV ANTIBODY (ROUTINE TESTING): HIV: NONREACTIVE

## 2017-03-25 LAB — OB RESULTS CONSOLE RUBELLA ANTIBODY, IGM: Rubella: IMMUNE

## 2017-03-25 LAB — OB RESULTS CONSOLE RPR: RPR: NONREACTIVE

## 2017-03-25 LAB — OB RESULTS CONSOLE HEPATITIS B SURFACE ANTIGEN: HEP B S AG: NEGATIVE

## 2017-11-11 ENCOUNTER — Inpatient Hospital Stay (HOSPITAL_COMMUNITY)
Admission: AD | Admit: 2017-11-11 | Discharge: 2017-11-12 | DRG: 807 | Disposition: A | Discharge status: Home / Self Care | Payer: 59 | Attending: Obstetrics & Gynecology | Admitting: Obstetrics & Gynecology

## 2017-11-11 ENCOUNTER — Inpatient Hospital Stay (HOSPITAL_COMMUNITY): Payer: 59 | Admitting: Anesthesiology

## 2017-11-11 ENCOUNTER — Encounter (HOSPITAL_COMMUNITY): Payer: Self-pay | Admitting: *Deleted

## 2017-11-11 ENCOUNTER — Inpatient Hospital Stay (HOSPITAL_COMMUNITY)
Admission: AD | Admit: 2017-11-11 | Discharge: 2017-11-11 | Disposition: A | Discharge status: Home / Self Care | Payer: 59 | Source: Ambulatory Visit | Attending: Obstetrics & Gynecology | Admitting: Obstetrics & Gynecology

## 2017-11-11 DIAGNOSIS — Z3A40 40 weeks gestation of pregnancy: Secondary | ICD-10-CM | POA: Diagnosis not present

## 2017-11-11 DIAGNOSIS — Z3483 Encounter for supervision of other normal pregnancy, third trimester: Secondary | ICD-10-CM | POA: Diagnosis present

## 2017-11-11 DIAGNOSIS — O479 False labor, unspecified: Secondary | ICD-10-CM

## 2017-11-11 DIAGNOSIS — Z3490 Encounter for supervision of normal pregnancy, unspecified, unspecified trimester: Secondary | ICD-10-CM

## 2017-11-11 LAB — CBC
HEMATOCRIT: 35.6 % — AB (ref 36.0–46.0)
Hemoglobin: 12.5 g/dL (ref 12.0–15.0)
MCH: 34.4 pg — AB (ref 26.0–34.0)
MCHC: 35.1 g/dL (ref 30.0–36.0)
MCV: 98.1 fL (ref 78.0–100.0)
PLATELETS: 149 10*3/uL — AB (ref 150–400)
RBC: 3.63 MIL/uL — ABNORMAL LOW (ref 3.87–5.11)
RDW: 13.6 % (ref 11.5–15.5)
WBC: 4.7 10*3/uL (ref 4.0–10.5)

## 2017-11-11 LAB — TYPE AND SCREEN
ABO/RH(D): A POS
Antibody Screen: NEGATIVE

## 2017-11-11 MED ORDER — LIDOCAINE-EPINEPHRINE (PF) 2 %-1:200000 IJ SOLN
INTRAMUSCULAR | Status: DC | PRN
  Administered 2017-11-11: 2 mL via EPIDURAL
  Administered 2017-11-11 (×2): 4 mL via EPIDURAL

## 2017-11-11 MED ORDER — EPHEDRINE 5 MG/ML INJ: 10.0000 mg | INTRAVENOUS | Status: DC | PRN

## 2017-11-11 MED ORDER — OXYTOCIN 40 UNITS IN LACTATED RINGERS INFUSION - SIMPLE MED: 1.0000 m[IU]/min | INTRAVENOUS | Status: DC

## 2017-11-11 MED ORDER — ONDANSETRON HCL 4 MG PO TABS: 4.0000 mg | ORAL_TABLET | ORAL | Status: DC | PRN

## 2017-11-11 MED ORDER — OXYCODONE-ACETAMINOPHEN 5-325 MG PO TABS: 2.0000 | ORAL_TABLET | ORAL | Status: DC | PRN

## 2017-11-11 MED ORDER — LIDOCAINE HCL (PF) 1 % IJ SOLN
30.0000 mL | INTRAMUSCULAR | Status: DC | PRN
  Filled 2017-11-11: qty 30

## 2017-11-11 MED ORDER — PRENATAL MULTIVITAMIN CH
1.0000 | ORAL_TABLET | Freq: Every day | ORAL | Status: DC
  Administered 2017-11-12: 1 via ORAL
  Filled 2017-11-11: qty 1

## 2017-11-11 MED ORDER — INFLUENZA VAC SPLIT QUAD 0.5 ML IM SUSY: 0.5000 mL | PREFILLED_SYRINGE | INTRAMUSCULAR | Status: DC

## 2017-11-11 MED ORDER — SENNOSIDES-DOCUSATE SODIUM 8.6-50 MG PO TABS
2.0000 | ORAL_TABLET | ORAL | Status: DC
  Administered 2017-11-12: 2 via ORAL
  Filled 2017-11-11: qty 2

## 2017-11-11 MED ORDER — OXYTOCIN 40 UNITS IN LACTATED RINGERS INFUSION - SIMPLE MED
1.0000 m[IU]/min | INTRAVENOUS | Status: DC
  Administered 2017-11-11 (×2): 2 m[IU]/min via INTRAVENOUS
  Filled 2017-11-11: qty 1000

## 2017-11-11 MED ORDER — OXYTOCIN 40 UNITS IN LACTATED RINGERS INFUSION - SIMPLE MED
1.0000 m[IU]/min | INTRAVENOUS | Status: DC
  Administered 2017-11-11: 4 m[IU]/min via INTRAVENOUS

## 2017-11-11 MED ORDER — LACTATED RINGERS IV SOLN: 500.0000 mL | INTRAVENOUS | Status: DC | PRN

## 2017-11-11 MED ORDER — PHENYLEPHRINE 40 MCG/ML (10ML) SYRINGE FOR IV PUSH (FOR BLOOD PRESSURE SUPPORT)
80.0000 ug | PREFILLED_SYRINGE | INTRAVENOUS | Status: DC | PRN
  Filled 2017-11-11: qty 10

## 2017-11-11 MED ORDER — ONDANSETRON HCL 4 MG/2ML IJ SOLN: 4.0000 mg | INTRAMUSCULAR | Status: DC | PRN

## 2017-11-11 MED ORDER — LACTATED RINGERS IV SOLN
500.0000 mL | Freq: Once | INTRAVENOUS | Status: AC
  Administered 2017-11-11: 500 mL via INTRAVENOUS

## 2017-11-11 MED ORDER — DIPHENHYDRAMINE HCL 25 MG PO CAPS: 25.0000 mg | ORAL_CAPSULE | Freq: Four times a day (QID) | ORAL | Status: DC | PRN

## 2017-11-11 MED ORDER — TERBUTALINE SULFATE 1 MG/ML IJ SOLN: 0.2500 mg | Freq: Once | INTRAMUSCULAR | Status: DC | PRN

## 2017-11-11 MED ORDER — ACETAMINOPHEN 325 MG PO TABS: 650.0000 mg | ORAL_TABLET | ORAL | Status: DC | PRN

## 2017-11-11 MED ORDER — DIPHENHYDRAMINE HCL 50 MG/ML IJ SOLN: 12.5000 mg | INTRAMUSCULAR | Status: DC | PRN

## 2017-11-11 MED ORDER — FLEET ENEMA 7-19 GM/118ML RE ENEM: 1.0000 | ENEMA | Freq: Every day | RECTAL | Status: DC | PRN

## 2017-11-11 MED ORDER — IBUPROFEN 600 MG PO TABS
600.0000 mg | ORAL_TABLET | Freq: Four times a day (QID) | ORAL | Status: DC
  Administered 2017-11-12 (×4): 600 mg via ORAL
  Filled 2017-11-11 (×4): qty 1

## 2017-11-11 MED ORDER — ONDANSETRON HCL 4 MG/2ML IJ SOLN: 4.0000 mg | Freq: Four times a day (QID) | INTRAMUSCULAR | Status: DC | PRN

## 2017-11-11 MED ORDER — OXYCODONE-ACETAMINOPHEN 5-325 MG PO TABS: 1.0000 | ORAL_TABLET | ORAL | Status: DC | PRN

## 2017-11-11 MED ORDER — BENZOCAINE-MENTHOL 20-0.5 % EX AERO
1.0000 "application " | INHALATION_SPRAY | CUTANEOUS | Status: DC | PRN
  Administered 2017-11-12: 1 via TOPICAL
  Filled 2017-11-11: qty 56

## 2017-11-11 MED ORDER — OXYTOCIN BOLUS FROM INFUSION
500.0000 mL | Freq: Once | INTRAVENOUS | Status: AC
  Administered 2017-11-11: 500 mL via INTRAVENOUS

## 2017-11-11 MED ORDER — WITCH HAZEL-GLYCERIN EX PADS: 1.0000 "application " | MEDICATED_PAD | CUTANEOUS | Status: DC | PRN

## 2017-11-11 MED ORDER — DIBUCAINE 1 % RE OINT: 1.0000 "application " | TOPICAL_OINTMENT | RECTAL | Status: DC | PRN

## 2017-11-11 MED ORDER — DOCUSATE SODIUM 100 MG PO CAPS
100.0000 mg | ORAL_CAPSULE | Freq: Two times a day (BID) | ORAL | Status: DC
  Administered 2017-11-12: 100 mg via ORAL
  Filled 2017-11-11: qty 1

## 2017-11-11 MED ORDER — ZOLPIDEM TARTRATE 5 MG PO TABS: 5.0000 mg | ORAL_TABLET | Freq: Every evening | ORAL | Status: DC | PRN

## 2017-11-11 MED ORDER — PHENYLEPHRINE 40 MCG/ML (10ML) SYRINGE FOR IV PUSH (FOR BLOOD PRESSURE SUPPORT): 80.0000 ug | PREFILLED_SYRINGE | INTRAVENOUS | Status: DC | PRN

## 2017-11-11 MED ORDER — SIMETHICONE 80 MG PO CHEW: 80.0000 mg | CHEWABLE_TABLET | ORAL | Status: DC | PRN

## 2017-11-11 MED ORDER — LACTATED RINGERS IV SOLN
INTRAVENOUS | Status: DC
  Administered 2017-11-11: 15:00:00 via INTRAVENOUS

## 2017-11-11 MED ORDER — LACTATED RINGERS AMNIOINFUSION
INTRAVENOUS | Status: DC
  Administered 2017-11-11: 18:00:00 via INTRAUTERINE

## 2017-11-11 MED ORDER — ERYTHROMYCIN 5 MG/GM OP OINT
TOPICAL_OINTMENT | OPHTHALMIC | Status: AC
  Filled 2017-11-11: qty 1

## 2017-11-11 MED ORDER — OXYTOCIN 40 UNITS IN LACTATED RINGERS INFUSION - SIMPLE MED: 2.5000 [IU]/h | INTRAVENOUS | Status: DC

## 2017-11-11 MED ORDER — SOD CITRATE-CITRIC ACID 500-334 MG/5ML PO SOLN: 30.0000 mL | ORAL | Status: DC | PRN

## 2017-11-11 MED ORDER — COCONUT OIL OIL: 1.0000 "application " | TOPICAL_OIL | Status: DC | PRN

## 2017-11-11 MED ORDER — FENTANYL 2.5 MCG/ML BUPIVACAINE 1/10 % EPIDURAL INFUSION (WH - ANES)
14.0000 mL/h | INTRAMUSCULAR | Status: DC | PRN
  Administered 2017-11-11 (×2): 14 mL/h via EPIDURAL
  Filled 2017-11-11 (×2): qty 100

## 2017-11-11 NOTE — Progress Notes (Signed)
OB PN:  S: Pt resting comfortably, no acute complaints  O: BP 108/66   Pulse 82   Temp 98.3 F (36.8 C) (Oral)   Resp 20   FHT: 140bpm, moderate variablity, + accels, no decels Toco: q2-433min SVE: 7-8/80/-2  A/P: 32 y.o. [id number] @ 5152w4d in active labor 1. FWB: Cat. I 2. Labor: continue Pit per protocl Pain: continue epidural GBS: negative  Myna HidalgoJennifer Mineola Duan, DO [phone] (cell) [phone] (office)

## 2017-11-11 NOTE — Discharge Instructions (Signed)
Braxton Hicks Contractions °Contractions of the uterus can occur throughout pregnancy, but they are not always a sign that you are in labor. You may have practice contractions called Braxton Hicks contractions. These false labor contractions are sometimes confused with true labor. °What are Braxton Hicks contractions? °Braxton Hicks contractions are tightening movements that occur in the muscles of the uterus before labor. Unlike true labor contractions, these contractions do not result in opening (dilation) and thinning of the cervix. Toward the end of pregnancy (32-34 weeks), Braxton Hicks contractions can happen more often and may become stronger. These contractions are sometimes difficult to tell apart from true labor because they can be very uncomfortable. You should not feel embarrassed if you go to the hospital with false labor. °Sometimes, the only way to tell if you are in true labor is for your health care provider to look for changes in the cervix. The health care provider will do a physical exam and may monitor your contractions. If you are not in true labor, the exam should show that your cervix is not dilating and your water has not broken. °If there are other health problems associated with your pregnancy, it is completely safe for you to be sent home with false labor. You may continue to have Braxton Hicks contractions until you go into true labor. °How to tell the difference between true labor and false labor °True labor °· Contractions last 30-70 seconds. °· Contractions become very regular. °· Discomfort is usually felt in the top of the uterus, and it spreads to the lower abdomen and low back. °· Contractions do not go away with walking. °· Contractions usually become more intense and increase in frequency. °· The cervix dilates and gets thinner. °False labor °· Contractions are usually shorter and not as strong as true labor contractions. °· Contractions are usually irregular. °· Contractions  are often felt in the front of the lower abdomen and in the groin. °· Contractions may go away when you walk around or change positions while lying down. °· Contractions get weaker and are shorter-lasting as time goes on. °· The cervix usually does not dilate or become thin. °Follow these instructions at home: °· Take over-the-counter and prescription medicines only as told by your health care provider. °· Keep up with your usual exercises and follow other instructions from your health care provider. °· Eat and drink lightly if you think you are going into labor. °· If Braxton Hicks contractions are making you uncomfortable: °? Change your position from lying down or resting to walking, or change from walking to resting. °? Sit and rest in a tub of warm water. °? Drink enough fluid to keep your urine pale yellow. Dehydration may cause these contractions. °? Do slow and deep breathing several times an hour. °· Keep all follow-up prenatal visits as told by your health care provider. This is important. °Contact a health care provider if: °· You have a fever. °· You have continuous pain in your abdomen. °Get help right away if: °· Your contractions become stronger, more regular, and closer together. °· You have fluid leaking or gushing from your vagina. °· You pass blood-tinged mucus (bloody show). °· You have bleeding from your vagina. °· You have low back pain that you never had before. °· You feel your baby’s head pushing down and causing pelvic pressure. °· Your baby is not moving inside you as much as it used to. °Summary °· Contractions that occur before labor are called Braxton   Hicks contractions, false labor, or practice contractions. °· Braxton Hicks contractions are usually shorter, weaker, farther apart, and less regular than true labor contractions. True labor contractions usually become progressively stronger and regular and they become more frequent. °· Manage discomfort from Braxton Hicks contractions by  changing position, resting in a warm bath, drinking plenty of water, or practicing deep breathing. °This information is not intended to replace advice given to you by your health care provider. Make sure you discuss any questions you have with your health care provider. °Document Released: 06/21/2016 Document Revised: 06/21/2016 Document Reviewed: 06/21/2016 °Elsevier Interactive Patient Education © 2018 [employer] ° °

## 2017-11-11 NOTE — Anesthesia Procedure Notes (Signed)
Epidural Patient location during procedure: OB Start time: 11/11/2017 11:50 AM End time: 11/11/2017 12:05 PM  Staffing Anesthesiologist: Elmer PickerWoodrum, Trini Soldo L, MD Performed: anesthesiologist   Preanesthetic Checklist Completed: patient identified, pre-op evaluation, timeout performed, IV checked, risks and benefits discussed and monitors and equipment checked  Epidural Patient position: sitting Prep: site prepped and draped and DuraPrep Patient monitoring: continuous pulse ox, blood pressure, heart rate and cardiac monitor Approach: midline Location: L3-L4 Injection technique: LOR air  Needle:  Needle type: Tuohy  Needle gauge: 17 G Needle length: 9 cm Needle insertion depth: 5 cm Catheter type: closed end flexible Catheter size: 19 Gauge Catheter at skin depth: 10 cm Test dose: negative  Assessment Sensory level: T8 Events: blood not aspirated, injection not painful, no injection resistance, negative IV test and no paresthesia  Additional Notes Patient identified. Risks/Benefits/Options discussed with patient including but not limited to bleeding, infection, nerve damage, paralysis, failed block, incomplete pain control, headache, blood pressure changes, nausea, vomiting, reactions to medication both or allergic, itching and postpartum back pain. Confirmed with bedside nurse the patient's most recent platelet count. Confirmed with patient that they are not currently taking any anticoagulation, have any bleeding history or any family history of bleeding disorders. Patient expressed understanding and wished to proceed. All questions were answered. Sterile technique was used throughout the entire procedure. Please see nursing notes for vital signs. Test dose was given through epidural catheter and negative prior to continuing to dose epidural or start infusion. Warning signs of high block given to the patient including shortness of breath, tingling/numbness in hands, complete motor block,  or any concerning symptoms with instructions to call for help. Patient was given instructions on fall risk and not to get out of bed. All questions and concerns addressed with instructions to call with any issues or inadequate analgesia.  Reason for block:procedure for pain

## 2017-11-11 NOTE — MAU Note (Signed)
[facility] and reviewed vital signs:  Vitals:   11/11/17 0058 11/11/17 0239  BP: 120/77 121/78  Pulse: 81 66  Resp: 16 16  Temp: 98.4 F (36.9 C)     Vaginal exam:  Dilation: 3 Effacement (%): 80 Cervical Position: Middle Station: -2 Presentation: Vertex Exam by:: Domenic Politek. WeissRN,   Also reviewed contraction pattern and that non-stress test is reactive.  It has been documented that patient is contracting every 6  minutes with minimal cervical change over 1 1/2  hours not indicating active labor.  Patient denies any other complaints.  Based on this report provider has given order for discharge.  A discharge order and diagnosis entered by a provider.   Labor discharge instructions reviewed with patient.

## 2017-11-11 NOTE — H&P (Addendum)
HPI: 32 y/o G4P3003 @ 968w4d estimated gestational age (as dated by LMP c/w 20 week ultrasound) presents for IOL due to early labor.   no Leaking of Fluid,   no Vaginal Bleeding,   + Uterine Contractions,  + Fetal Movement.  Prenatal care has been provided by Dr. Charlotta Newtonzan  ROS: no HA, no epigastric pain, no visual changes.    Pregnancy complicated by: uncomplicated   Prenatal Transfer Tool  Maternal Diabetes: No Genetic Screening: Declined Maternal Ultrasounds/Referrals: Normal Fetal Ultrasounds or other Referrals:  None Maternal Substance Abuse:  No Significant Maternal Medications:  None Significant Maternal Lab Results: Lab values include: Group B Strep negative   PNL:  GBS neg, Rub Immune, Hep B neg, RPR NR, HIV neg, GC/C neg, glucola: 143, 3hr GTT wnl Hgb: 12.3 Blood type: A positive, antibody neg  Immunizations: Tdap: 7/16  OBHx: FTNSVD x 3, 6#12oz -7#11 oz (uncomplicated) PMHx:  none Meds:  PNV Allergy:  No Known Allergies SurgHx: 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 AAOx3, NAD CV.  RRR  No murmur.  Resp. CTAB, no wheeze or crackles. Abd. Gravid,  no tenderness,  no rigidity,  no guarding Extr.  no edema B/L , no calf tenderness, neg Homan's B/L FHT: 135 baseline, mod variability, + accels,  no decels Toco: irregular SVE: 3/80/-2, AROM clear  Labs: see orders  A/P:  32 y.o. [id number] @ 3168w4d EGA who presents for IOL -FWB:  NICHD Cat I FHTs -Labor: plan for Pit per protocol -GBS: neg -continue with epidural  Myna HidalgoJennifer Leetta Hendriks, DO [phone] (cell) [phone] (office)

## 2017-11-11 NOTE — Anesthesia Preprocedure Evaluation (Signed)
Anesthesia Evaluation  Patient identified by MRN, date of birth, ID band Patient awake    Reviewed: Allergy & Precautions, NPO status , Patient's Chart, lab work & pertinent test results  Airway Mallampati: I  TM Distance: >3 FB Neck ROM: Full    Dental no notable dental hx. (+) Teeth Intact   Pulmonary neg pulmonary ROS,    Pulmonary exam normal breath sounds clear to auscultation       Cardiovascular negative cardio ROS Normal cardiovascular exam Rhythm:Regular Rate:Normal     Neuro/Psych  Headaches, negative neurological ROS  negative psych ROS   GI/Hepatic negative GI ROS, Neg liver ROS,   Endo/Other  negative endocrine ROS  Renal/GU negative Renal ROS  negative genitourinary   Musculoskeletal negative musculoskeletal ROS (+)   Abdominal   Peds  Hematology negative hematology ROS (+)   Anesthesia Other Findings   Reproductive/Obstetrics (+) Pregnancy                             Anesthesia Physical Anesthesia Plan  ASA: II  Anesthesia Plan: Epidural   Post-op Pain Management:    Induction:   PONV Risk Score and Plan: Treatment may vary due to age or medical condition  Airway Management Planned: Natural Airway  Additional Equipment:   Intra-op Plan:   Post-operative Plan:   Informed Consent: I have reviewed the patients History and Physical, chart, labs and discussed the procedure including the risks, benefits and alternatives for the proposed anesthesia with the patient or authorized representative who has indicated his/her understanding and acceptance.     Plan Discussed with: Anesthesiologist  Anesthesia Plan Comments: (Patient identified. Risks, benefits, options discussed with patient including but not limited to bleeding, infection, nerve damage, paralysis, failed block, incomplete pain control, headache, blood pressure changes, nausea, vomiting, reactions to  medication, itching, and post partum back pain. Confirmed with bedside nurse the patient's most recent platelet count. Confirmed with the patient that they are not taking any anticoagulation, have any bleeding history or any family history of bleeding disorders. Patient expressed understanding and wishes to proceed. All questions were answered. )        Anesthesia Quick Evaluation

## 2017-11-11 NOTE — Anesthesia Pain Management Evaluation Note (Signed)
  [facility] Pain Management Visit Note  Patient: [name], 32 y.o., female  "[facility] We have an anesthesia team available at all times to provide care throughout the hospital, including epidural management and anesthesia for C-section. I don't know your plan for the delivery whether it a natural birth, water birth, IV sedation, nitrous supplementation, doula or epidural, but we want to meet your pain goals."   1.Was your pain managed to your expectations on prior hospitalizations?   Yes   2.What is your expectation for pain management during this hospitalization?     Epidural  3.How can we help you reach that goal?  Maintain epidural until delivery of infant.  Record the patient's initial score and the patient's pain goal.   Pain: 2  Pain Goal: 2 [facility] wants you to be able to say your pain was always managed very well.  Riese Hellard 11/11/2017

## 2017-11-12 LAB — CBC
HCT: 35.7 % — ABNORMAL LOW (ref 36.0–46.0)
Hemoglobin: 12.3 g/dL (ref 12.0–15.0)
MCH: 34 pg (ref 26.0–34.0)
MCHC: 34.5 g/dL (ref 30.0–36.0)
MCV: 98.6 fL (ref 78.0–100.0)
PLATELETS: 147 10*3/uL — AB (ref 150–400)
RBC: 3.62 MIL/uL — AB (ref 3.87–5.11)
RDW: 13.6 % (ref 11.5–15.5)
WBC: 7.2 10*3/uL (ref 4.0–10.5)

## 2017-11-12 LAB — RPR: RPR: NONREACTIVE

## 2017-11-12 MED ORDER — OXYCODONE HCL 5 MG PO TABS: 5.0000 mg | ORAL_TABLET | Freq: Four times a day (QID) | ORAL | Status: DC | PRN

## 2017-11-12 MED ORDER — DOCUSATE SODIUM 100 MG PO CAPS: 100.0000 mg | ORAL_CAPSULE | Freq: Two times a day (BID) | ORAL | 0 refills | Status: DC

## 2017-11-12 MED ORDER — IBUPROFEN 600 MG PO TABS: 600.0000 mg | ORAL_TABLET | Freq: Four times a day (QID) | ORAL | 0 refills | Status: DC

## 2017-11-12 NOTE — Discharge Instructions (Signed)
Vaginal Delivery, Care After °Refer to this sheet in the next few weeks. These instructions provide you with information about caring for yourself after vaginal delivery. Your health care provider may also give you more specific instructions. Your treatment has been planned according to current medical practices, but problems sometimes occur. Call your health care provider if you have any problems or questions. °What can I expect after the procedure? °After vaginal delivery, it is common to have: °· Some bleeding from your vagina. °· Soreness in your abdomen, your vagina, and the area of skin between your vaginal opening and your anus (perineum). °· Pelvic cramps. °· Fatigue. ° °Follow these instructions at home: °Medicines °· Take over-the-counter and prescription medicines only as told by your health care provider. °· If you were prescribed an antibiotic medicine, take it as told by your health care provider. Do not stop taking the antibiotic until it is finished. °Driving ° °· Do not drive or operate heavy machinery while taking prescription pain medicine. °· Do not drive for 24 hours if you received a sedative. °Lifestyle °· Do not drink alcohol. This is especially important if you are breastfeeding or taking medicine to relieve pain. °· Do not use tobacco products, including cigarettes, chewing tobacco, or e-cigarettes. If you need help quitting, ask your health care provider. °Eating and drinking °· Drink at least 8 eight-ounce glasses of water every day unless you are told not to by your health care provider. If you choose to breastfeed your baby, you may need to drink more water than this. °· Eat high-fiber foods every day. These foods may help prevent or relieve constipation. High-fiber foods include: °? Whole grain cereals and breads. °? Brown rice. °? Beans. °? Fresh fruits and vegetables. °Activity °· Return to your normal activities as told by your health care provider. Ask your health care provider  what activities are safe for you. °· Rest as much as possible. Try to rest or take a nap when your baby is sleeping. °· Do not lift anything that is heavier than your baby or 10 lb (4.5 kg) until your health care provider says that it is safe. °· Talk with your health care provider about when you can engage in sexual activity. This may depend on your: °? Risk of infection. °? Rate of healing. °? Comfort and desire to engage in sexual activity. °Vaginal Care °· If you have an episiotomy or a vaginal tear, check the area every day for signs of infection. Check for: °? More redness, swelling, or pain. °? More fluid or blood. °? Warmth. °? Pus or a bad smell. °· Do not use tampons or douches until your health care provider says this is safe. °· Watch for any blood clots that may pass from your vagina. These may look like clumps of dark red, brown, or black discharge. °General instructions °· Keep your perineum clean and dry as told by your health care provider. °· Wear loose, comfortable clothing. °· Wipe from front to back when you use the toilet. °· Ask your health care provider if you can shower or take a bath. If you had an episiotomy or a perineal tear during labor and delivery, your health care provider may tell you not to take baths for a certain length of time. °· Wear a bra that supports your breasts and fits you well. °· If possible, have someone help you with household activities and help care for your baby for at least a few days after   you leave the hospital. °· Keep all follow-up visits for you and your baby as told by your health care provider. This is important. °Contact a health care provider if: °· You have: °? Vaginal discharge that has a bad smell. °? Difficulty urinating. °? Pain when urinating. °? A sudden increase or decrease in the frequency of your bowel movements. °? More redness, swelling, or pain around your episiotomy or vaginal tear. °? More fluid or blood coming from your episiotomy or  vaginal tear. °? Pus or a bad smell coming from your episiotomy or vaginal tear. °? A fever. °? A rash. °? Little or no interest in activities you used to enjoy. °? Questions about caring for yourself or your baby. °· Your episiotomy or vaginal tear feels warm to the touch. °· Your episiotomy or vaginal tear is separating or does not appear to be healing. °· Your breasts are painful, hard, or turn red. °· You feel unusually sad or worried. °· You feel nauseous or you vomit. °· You pass large blood clots from your vagina. If you pass a blood clot from your vagina, save it to show to your health care provider. Do not flush blood clots down the toilet without having your health care provider look at them. °· You urinate more than usual. °· You are dizzy or light-headed. °· You have not breastfed at all and you have not had a menstrual period for 12 weeks after delivery. °· You have stopped breastfeeding and you have not had a menstrual period for 12 weeks after you stopped breastfeeding. °Get help right away if: °· You have: °? Pain that does not go away or does not get better with medicine. °? Chest pain. °? Difficulty breathing. °? Blurred vision or spots in your vision. °? Thoughts about hurting yourself or your baby. °· You develop pain in your abdomen or in one of your legs. °· You develop a severe headache. °· You faint. °· You bleed from your vagina so much that you fill two sanitary pads in one hour. °This information is not intended to replace advice given to you by your health care provider. Make sure you discuss any questions you have with your health care provider. °Document Released: 02/03/2000 Document Revised: 07/20/2015 Document Reviewed: 02/20/2015 °Elsevier Interactive Patient Education © 2018 [employer] ° °

## 2017-11-12 NOTE — Anesthesia Postprocedure Evaluation (Signed)
Anesthesia Post Note  Patient: [name]  Procedure(s) Performed: AN AD HOC LABOR EPIDURAL     Patient location during evaluation: Mother Baby Anesthesia Type: Epidural Level of consciousness: awake and alert Pain management: pain level controlled Vital Signs Assessment: post-procedure vital signs reviewed and stable Respiratory status: spontaneous breathing, nonlabored ventilation and respiratory function stable Cardiovascular status: stable Postop Assessment: no headache, no backache, epidural receding, no apparent nausea or vomiting, patient able to bend at knees, adequate PO intake and able to ambulate Anesthetic complications: no    Last Vitals:  Vitals:   11/12/17 0245 11/12/17 0625  BP: 102/65 111/72  Pulse: (!) 59 (!) 49  Resp: 18 18  Temp: 37.1 C 36.7 C  SpO2:      Last Pain:  Vitals:   11/12/17 0715  TempSrc:   PainSc: 0-No pain   Pain Goal:                 Land O'LakesMalinova,Shannette Tabares Hristova

## 2017-11-12 NOTE — Progress Notes (Signed)
Postpartum Note Day # 1  S:  Patient resting comfortable in bed.  Pain controlled.  Tolerating general diet. No flatus, no BM.  Lochia moderate.  Ambulating without difficulty.  She denies n/v/f/c, SOB, or CP.  Pt plans on breastfeeding.  O: Temp:  [97.7 F (36.5 C)-98.8 F (37.1 C)] 98 F (36.7 C) (09/24 0625) Pulse Rate:  [49-139] 49 (09/24 0625) Resp:  [16-20] 18 (09/24 0625) BP: (99-127)/(54-77) 111/72 (09/24 0625) SpO2:  [99 %] 99 % (09/23 1930)   Gen: A&Ox3, NAD CV: RRR, no MRG Resp: CTAB Abdomen: soft, NT, +distension noted Uterus: firm, non-tender, below umbilicus Ext: No edema, no calf tenderness bilaterally  Labs:  Recent Labs    11/11/17 1100 11/12/17 0540  HGB 12.5 12.3    A/P: Pt is a 32 y.o. [id number] s/p NSVD, PPD#1  - Pain well controlled -GU: voiding freely -GI: Tolerating general diet -Activity: encouraged sitting up to chair and ambulation as tolerated -Prophylaxis: early ambulation -Labs: stable as above  Pending baby, plan for discharge home later today  Myna HidalgoJennifer Jadeyn Hargett, DO [phone] (cell) [phone] (office)

## 2017-11-12 NOTE — Plan of Care (Signed)
Progressing appropriately. Encouraged to call for assistance as needed, and [facility]. Room orientation complete and safety information gone over.

## 2017-11-12 NOTE — Plan of Care (Signed)
Progressing appropriately. Encouraged to call for assistance as needed, and for LATCH assessment. Safety information and room orientation complete. 

## 2017-11-12 NOTE — Lactation Note (Signed)
This note was copied from a baby's chart. Lactation Consultation Note: Experienced BF mom reports baby is latching well. LS 10 by RN. Several family members present. BF brochure given- reviewed our phone number to call with questions after DC. No questions at present. To call prn  Patient Name: [name] ZOXWR'UToday's Date: 11/12/2017 Reason for consult: Initial assessment   Maternal Data Formula Feeding for Exclusion: No Does the patient have breastfeeding experience prior to this delivery?: Yes  Feeding LATCH Score                   Interventions    Lactation Tools Discussed/Used     Consult Status Consult Status: PRN    Pamelia HoitWeeks, Anala Whisenant D 11/12/2017, 9:27 AM

## 2017-11-12 NOTE — Discharge Summary (Signed)
OB Discharge Summary     Patient Name: [name] DOB: 1985-08-31 MRN: [id number]  Date of admission: 11/11/2017 Delivering MD: Myna HidalgoZAN, JENNIFER   Date of discharge: 11/12/2017  Admitting diagnosis: INDUCTION Intrauterine pregnancy: 5764w4d     Secondary diagnosis:  Active Problems:   Pregnancy  Additional problems: None     Discharge diagnosis: Term Pregnancy Delivered                                                                                                Post partum procedures:None  Augmentation: AROM and Pitocin  Complications: None  Hospital course:  Induction of Labor With Vaginal Delivery   32 y.o. yo [id number] at 5664w4d was admitted to the hospital 11/11/2017 for induction of labor.  Indication for induction: Pt was in early labor.  Patient had an uncomplicated labor course as follows: Membrane Rupture Time/Date: 12:57 PM ,11/11/2017   Intrapartum Procedures: Episiotomy: None [1]                                         Lacerations:  1st degree [2]  Patient had delivery of a Viable infant.  Information for the patient's newborn:  Burt EkLust, Girl Connie [[id number]][[id number]]  Delivery Method: Vag-Spont   11/11/2017  Details of delivery can be found in separate delivery note.  Patient had a routine postpartum course. Patient is discharged home 11/12/17.  Physical exam   See progress note on day of discharge Vitals:   11/11/17 2244 11/12/17 0245 11/12/17 0625 11/12/17 1227  BP:  102/65 111/72 121/83  Pulse:  (!) 59 (!) 49 (!) 58  Resp: 18 18 18    Temp:  98.8 F (37.1 C) 98 F (36.7 C) 97.9 F (36.6 C)  TempSrc:  Oral Oral Oral  SpO2:        Labs: Lab Results  Component Value Date   WBC 7.2 11/12/2017   HGB 12.3 11/12/2017   HCT 35.7 (L) 11/12/2017   MCV 98.6 11/12/2017   PLT 147 (L) 11/12/2017   No flowsheet data found.  Discharge instruction: per After Visit Summary and "Baby and Me Booklet".  After visit meds:  Allergies as of 11/12/2017   No Known  Allergies     Medication List    STOP taking these medications   acetaminophen 325 MG tablet Commonly known as:  TYLENOL   prenatal multivitamin Tabs tablet     TAKE these medications   docusate sodium 100 MG capsule Commonly known as:  COLACE Take 1 capsule (100 mg total) by mouth 2 (two) times daily. What changed:    when to take this  reasons to take this   ibuprofen 600 MG tablet Commonly known as:  ADVIL,MOTRIN Take 1 tablet (600 mg total) by mouth every 6 (six) hours. What changed:    when to take this  reasons to take this       Diet: routine diet  Activity: Advance as tolerated. Pelvic rest for 6 weeks.   Outpatient follow  up:6 weeks Follow up Appt:No future appointments. Follow up Visit:No follow-ups on file.  Postpartum contraception: Discussed  Newborn Data: Live born female  Birth Weight: 7 lb 2.5 oz (3245 g) APGAR: 8, 9  Newborn Delivery   Birth date/time:  11/11/2017 19:42:00 Delivery type:  Vaginal, Spontaneous     Baby Feeding: See note Disposition:home with mother   11/12/2017 Geryl Rankins, MD

## 2018-05-15 ENCOUNTER — Ambulatory Visit: Payer: 59 | Admitting: Family Medicine

## 2018-06-23 ENCOUNTER — Encounter: Payer: Self-pay | Admitting: Family Medicine

## 2018-06-23 ENCOUNTER — Ambulatory Visit (INDEPENDENT_AMBULATORY_CARE_PROVIDER_SITE_OTHER): Payer: 59 | Admitting: Family Medicine

## 2018-06-23 VITALS — Temp 97.1°F | Ht 63.0 in | Wt 135.0 lb

## 2018-06-23 DIAGNOSIS — Z0000 Encounter for general adult medical examination without abnormal findings: Secondary | ICD-10-CM

## 2018-06-23 DIAGNOSIS — F419 Anxiety disorder, unspecified: Secondary | ICD-10-CM

## 2018-06-23 MED ORDER — SERTRALINE HCL 25 MG PO TABS: 25.0000 mg | ORAL_TABLET | Freq: Every day | ORAL | 1 refills | Status: DC

## 2018-06-23 NOTE — Progress Notes (Signed)
Appointments scheduled for lab (5/6) and 1 month follow up for anxiety (6/3)

## 2018-06-23 NOTE — Progress Notes (Signed)
Patient: [name] MRN: [id number] DOB: 1985-03-16 PCP: Edwinna AreolaBanga, Cecilia Worema, DO     I connected with Nelva BushKatelyn S Frandsen on 06/23/18 at 8:15am by a video enabled telemedicine application and verified that I am speaking with the correct person using two identifiers.  Location patient: Home Location provider: [facility], Office Persons participating in this virtual visit: Saliyah Dutkiewicz and Dr. Artis FlockWolfe   I discussed the limitations of evaluation and management by telemedicine and the availability of in person appointments. The patient expressed understanding and agreed to proceed.   Subjective:  Chief Complaint  Patient presents with  . Anxiety    HPI: The patient is a 33 y.o. female who presents today for establishing care and for complaints of anxiety. She is about 7-8 months post partum. She was never diagnosed with post partum depression, but thinks she had something with her second child. She started to have symptoms long before the covid pandemic started. Symptoms started in October, 1 month after her baby was born. She feels like her symptoms were more anxiety and that she was running at 100% all of the time. She was never sad or wanting to hurt her baby. She states she just felt overwhelmed with fear, impending doom and irritable. No true panic attacks. Since the covid she has actually improved since they are all stuck at home. She still has those feelings of anxiety deep down. She has no physical symptoms other than the anxiety. She is still breast feeding. She does exercise. Very involved in church and very supportive husband. She has her family in town that also helps out. No family history of mental health issues. No issues up until now.    She has hx of breast cancer in both her maternal and paternal grandmothers and her mother was diagnosed 2 years ago with breast cancer. No hx of colon cancer.   Tdap: 09/2017 Pap smear: 2019. wnl  Flu: utd.   Review of Systems   Constitutional: Negative for chills, fatigue and fever.  HENT: Negative for dental problem, ear pain, hearing loss and trouble swallowing.   Eyes: Negative for visual disturbance.  Respiratory: Negative for cough, chest tightness and shortness of breath.   Cardiovascular: Negative for chest pain, palpitations and leg swelling.  Gastrointestinal: Negative for abdominal pain, blood in stool, diarrhea and nausea.  Endocrine: Negative for cold intolerance, polydipsia, polyphagia and polyuria.  Genitourinary: Negative for dysuria and hematuria.  Musculoskeletal: Negative for arthralgias, back pain, myalgias and neck pain.  Skin: Negative.  Negative for rash.  Neurological: Positive for headaches. Negative for dizziness.  Psychiatric/Behavioral: Negative for dysphoric mood and sleep disturbance. The patient is not nervous/anxious.     Allergies Patient has No Known Allergies.  Past Medical History Patient  has a past medical history of ASD (atrial septal defect), H/O varicella, and Headache(784.0).  Surgical History Patient  has a past surgical history that includes ASD repair (1992).  Family History Pateint's family history is not on file.  Social History Patient  reports that she has never smoked. She has never used smokeless tobacco. She reports that she does not drink alcohol or use drugs.    Objective: Vitals:   06/23/18 0806  Temp: (!) 97.1 F (36.2 C)  TempSrc: Temporal  Weight: 135 lb (61.2 kg)  Height: 5\' 3"  (1.6 m)    Body mass index is 23.91 kg/m.  Physical Exam Vitals signs reviewed.  Constitutional:      Appearance: Normal appearance.  HENT:  Head: Normocephalic and atraumatic.  Eyes:     Extraocular Movements: Extraocular movements intact.  Pulmonary:     Effort: Pulmonary effort is normal.  Neurological:     General: No focal deficit present.     Mental Status: She is alert and oriented to person, place, and time.  Psychiatric:        Mood and  Affect: Mood normal.        Behavior: Behavior normal.     Comments: No si/hi/ah/vh     GAD 7 : Generalized Anxiety Score 06/23/2018  Nervous, Anxious, on Edge 2  Control/stop worrying 1  Worry too much - different things 3  Trouble relaxing 2  Restless 1  Easily annoyed or irritable 3  Afraid - awful might happen 1  Total GAD 7 Score 13  Anxiety Difficulty Somewhat difficult    Depression screen PHQ 2/9 06/23/2018  Decreased Interest 2  Down, Depressed, Hopeless 1  PHQ - 2 Score 3  Altered sleeping 1  Tired, decreased energy 3  Change in appetite 1  Feeling bad or failure about yourself  1  Trouble concentrating 3  Moving slowly or fidgety/restless 0  Suicidal thoughts 0  PHQ-9 Score 12  Difficult doing work/chores Somewhat difficult       Assessment/plan: 1. 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 Checking labs to make sure no thyroid issues with post partum thyroiditis. Her GAD7 score is moderately severe. Discussed since this is impacting her daily, I recommend we start her on a daily preventative medication. She is still breast feeding, so will go with zoloft since most studied and safest with breast feeding. Discussed first 2 weeks can be rough with increased anxiety, but we will start very low dose at 25mg  and titrate up to 50mg  if we need to. WE can always change to something else after she is done nursing. Can do bendaryl prn for increased anxiety, but may decrease her milk supply some and to watch and make sure baby not drowsy. We should be good since her baby is 7 months and eating foods. Will have her f/u closely in 1 month or sooner if needed. Side effects of SSRI discussed. Any si/hi she is to call 911 or go to ER.  - TSH; Future  2. Annual physical exam Future ordered her labs. Will come back in one month for actual physical exam.  - Lipid panel; Future - CBC with Differential/Platelet; Future - Comprehensive metabolic panel; Future - VITAMIN D 25 Hydroxy  (Vit-D Deficiency, Fractures); Future   Return in about 1 month (around 07/24/2018) for exam/anxiety f/u. in office. Orland Mustard, MD [facility]  06/23/2018

## 2018-06-24 ENCOUNTER — Encounter: Payer: Self-pay | Admitting: Family Medicine

## 2018-06-25 ENCOUNTER — Other Ambulatory Visit (INDEPENDENT_AMBULATORY_CARE_PROVIDER_SITE_OTHER): Payer: 59

## 2018-06-25 ENCOUNTER — Other Ambulatory Visit: Payer: Self-pay

## 2018-06-25 DIAGNOSIS — F419 Anxiety disorder, unspecified: Secondary | ICD-10-CM

## 2018-06-25 DIAGNOSIS — Z0000 Encounter for general adult medical examination without abnormal findings: Secondary | ICD-10-CM | POA: Diagnosis not present

## 2018-06-25 LAB — COMPREHENSIVE METABOLIC PANEL
ALT: 24 U/L (ref 0–35)
AST: 17 U/L (ref 0–37)
Albumin: 4.4 g/dL (ref 3.5–5.2)
Alkaline Phosphatase: 51 U/L (ref 39–117)
BUN: 18 mg/dL (ref 6–23)
CO2: 26 mEq/L (ref 19–32)
Calcium: 8.9 mg/dL (ref 8.4–10.5)
Chloride: 104 mEq/L (ref 96–112)
Creatinine, Ser: 0.62 mg/dL (ref 0.40–1.20)
GFR: 110.34 mL/min (ref >60.00)
Glucose, Bld: 87 mg/dL (ref 70–99)
Potassium: 4.2 mEq/L (ref 3.5–5.1)
Sodium: 139 mEq/L (ref 135–145)
Total Bilirubin: 0.5 mg/dL (ref 0.2–1.2)
Total Protein: 6.8 g/dL (ref 6.0–8.3)

## 2018-06-25 LAB — CBC WITH DIFFERENTIAL/PLATELET
Basophils Absolute: 0 10*3/uL (ref 0.0–0.1)
Basophils Relative: 0.7 % (ref 0.0–3.0)
Eosinophils Absolute: 0.1 10*3/uL (ref 0.0–0.7)
Eosinophils Relative: 1.9 % (ref 0.0–5.0)
HCT: 38.6 % (ref 36.0–46.0)
Hemoglobin: 13.2 g/dL (ref 12.0–15.0)
Lymphocytes Relative: 43.3 % (ref 12.0–46.0)
Lymphs Abs: 1.3 10*3/uL (ref 0.7–4.0)
MCHC: 34.2 g/dL (ref 30.0–36.0)
MCV: 95.7 fl (ref 78.0–100.0)
Monocytes Absolute: 0.3 10*3/uL (ref 0.1–1.0)
Monocytes Relative: 10 % (ref 3.0–12.0)
Neutro Abs: 1.3 10*3/uL — ABNORMAL LOW (ref 1.4–7.7)
Neutrophils Relative %: 44.1 % (ref 43.0–77.0)
Platelets: 221 10*3/uL (ref 150.0–400.0)
RBC: 4.04 Mil/uL (ref 3.87–5.11)
RDW: 14.3 % (ref 11.5–15.5)
WBC: 3 10*3/uL — ABNORMAL LOW (ref 4.0–10.5)

## 2018-06-25 LAB — LIPID PANEL
Cholesterol: 146 mg/dL (ref 0–200)
HDL: 65.3 mg/dL (ref >39.00)
LDL Cholesterol: 69 mg/dL (ref 0–99)
NonHDL: 80.31
Total CHOL/HDL Ratio: 2
Triglycerides: 55 mg/dL (ref 0.0–149.0)
VLDL: 11 mg/dL (ref 0.0–40.0)

## 2018-06-25 LAB — VITAMIN D 25 HYDROXY (VIT D DEFICIENCY, FRACTURES): VITD: 23.74 ng/mL — ABNORMAL LOW (ref 30.00–100.00)

## 2018-06-25 LAB — TSH: TSH: 1.93 u[IU]/mL (ref 0.35–4.50)

## 2018-06-26 ENCOUNTER — Encounter: Payer: Self-pay | Admitting: Family Medicine

## 2018-06-26 ENCOUNTER — Other Ambulatory Visit: Payer: Self-pay | Admitting: Family Medicine

## 2018-06-26 MED ORDER — VITAMIN D (ERGOCALCIFEROL) 1.25 MG (50000 UNIT) PO CAPS: ORAL_CAPSULE | ORAL | 0 refills | Status: DC

## 2018-06-30 ENCOUNTER — Other Ambulatory Visit: Payer: Self-pay | Admitting: Family Medicine

## 2018-06-30 MED ORDER — HYDROXYZINE HCL 10 MG PO TABS: 10.0000 mg | ORAL_TABLET | Freq: Three times a day (TID) | ORAL | 0 refills | Status: DC | PRN

## 2018-06-30 NOTE — Progress Notes (Signed)
.  h 

## 2018-07-23 ENCOUNTER — Ambulatory Visit: Payer: 59 | Admitting: Family Medicine

## 2018-07-25 ENCOUNTER — Other Ambulatory Visit: Payer: Self-pay | Admitting: Family Medicine

## 2019-07-17 ENCOUNTER — Other Ambulatory Visit: Payer: Self-pay | Admitting: Family Medicine

## 2019-07-17 ENCOUNTER — Ambulatory Visit (INDEPENDENT_AMBULATORY_CARE_PROVIDER_SITE_OTHER): Payer: 59 | Admitting: Family Medicine

## 2019-07-17 ENCOUNTER — Other Ambulatory Visit: Payer: Self-pay

## 2019-07-17 ENCOUNTER — Encounter: Payer: Self-pay | Admitting: Family Medicine

## 2019-07-17 VITALS — BP 110/60 | HR 73 | Temp 97.7°F | Ht 63.0 in | Wt 125.6 lb

## 2019-07-17 DIAGNOSIS — F419 Anxiety disorder, unspecified: Secondary | ICD-10-CM

## 2019-07-17 DIAGNOSIS — G8929 Other chronic pain: Secondary | ICD-10-CM

## 2019-07-17 DIAGNOSIS — E559 Vitamin D deficiency, unspecified: Secondary | ICD-10-CM | POA: Insufficient documentation

## 2019-07-17 DIAGNOSIS — Z0000 Encounter for general adult medical examination without abnormal findings: Secondary | ICD-10-CM

## 2019-07-17 DIAGNOSIS — M533 Sacrococcygeal disorders, not elsewhere classified: Secondary | ICD-10-CM | POA: Diagnosis not present

## 2019-07-17 LAB — CBC WITH DIFFERENTIAL/PLATELET
Basophils Absolute: 0 10*3/uL (ref 0.0–0.1)
Basophils Relative: 0.6 % (ref 0.0–3.0)
Eosinophils Absolute: 0 10*3/uL (ref 0.0–0.7)
Eosinophils Relative: 0.9 % (ref 0.0–5.0)
HCT: 39.2 % (ref 36.0–46.0)
Hemoglobin: 13 g/dL (ref 12.0–15.0)
Lymphocytes Relative: 29.6 % (ref 12.0–46.0)
Lymphs Abs: 1.2 10*3/uL (ref 0.7–4.0)
MCHC: 33.2 g/dL (ref 30.0–36.0)
MCV: 92.4 fl (ref 78.0–100.0)
Monocytes Absolute: 0.5 10*3/uL (ref 0.1–1.0)
Monocytes Relative: 11.2 % (ref 3.0–12.0)
Neutro Abs: 2.3 10*3/uL (ref 1.4–7.7)
Neutrophils Relative %: 57.7 % (ref 43.0–77.0)
Platelets: 213 10*3/uL (ref 150.0–400.0)
RBC: 4.24 Mil/uL (ref 3.87–5.11)
RDW: 14.2 % (ref 11.5–15.5)
WBC: 4.1 10*3/uL (ref 4.0–10.5)

## 2019-07-17 LAB — LIPID PANEL
Cholesterol: 153 mg/dL (ref 0–200)
HDL: 77.9 mg/dL (ref >39.00)
LDL Cholesterol: 65 mg/dL (ref 0–99)
NonHDL: 75.53
Total CHOL/HDL Ratio: 2
Triglycerides: 52 mg/dL (ref 0.0–149.0)
VLDL: 10.4 mg/dL (ref 0.0–40.0)

## 2019-07-17 LAB — COMPREHENSIVE METABOLIC PANEL
ALT: 14 U/L (ref 0–35)
AST: 15 U/L (ref 0–37)
Albumin: 4.5 g/dL (ref 3.5–5.2)
Alkaline Phosphatase: 35 U/L — ABNORMAL LOW (ref 39–117)
BUN: 16 mg/dL (ref 6–23)
CO2: 25 mEq/L (ref 19–32)
Calcium: 9.1 mg/dL (ref 8.4–10.5)
Chloride: 104 mEq/L (ref 96–112)
Creatinine, Ser: 0.6 mg/dL (ref 0.40–1.20)
GFR: 113.88 mL/min (ref >60.00)
Glucose, Bld: 103 mg/dL — ABNORMAL HIGH (ref 70–99)
Potassium: 4.4 mEq/L (ref 3.5–5.1)
Sodium: 137 mEq/L (ref 135–145)
Total Bilirubin: 0.6 mg/dL (ref 0.2–1.2)
Total Protein: 6.9 g/dL (ref 6.0–8.3)

## 2019-07-17 LAB — TSH: TSH: 1.47 u[IU]/mL (ref 0.35–4.50)

## 2019-07-17 LAB — VITAMIN D 25 HYDROXY (VIT D DEFICIENCY, FRACTURES): VITD: 24.13 ng/mL — ABNORMAL LOW (ref 30.00–100.00)

## 2019-07-17 MED ORDER — VITAMIN D (ERGOCALCIFEROL) 1.25 MG (50000 UNIT) PO CAPS: ORAL_CAPSULE | ORAL | 0 refills | Status: DC

## 2019-07-17 NOTE — Progress Notes (Signed)
Patient: [name] MRN: [id number] DOB: May 07, 1985 PCP: Orma Flaming, MD     Subjective:  Chief Complaint  Patient presents with  . Annual Exam  . Anxiety    HPI: The patient is a 34 y.o. female who presents today for annual exam. She denies any changes to past medical history. There have been no recent hospitalizations. They are following a well balanced diet and exercise plan. She does [place] workouts. Cardio 3 times weekly, and strength training 5 days a week. Weight has been stable. She complains of right side lower back pain. She assumes it is from having children.  She has hx of breast cancer in both her maternal and paternal grandmothers and her mother was diagnosed 2 years ago with breast cancer. No hx of colon cancer.  Anxiety I saw her a month ago via virtual visit and we stated her on hydroxyzine prn. She isn't really using this much. She uses CBD oil as needed and this has helped her physically. She feels like she is well controlled off medication. Will have periods of increased anxiety, but she feels like she is coping without medication.   Right sided back pain She feels like she has one leg longer than the other and she holds her kids on the right side. Pain is constant and noticeable. Pain in right SI joint. She doesn't do anything that helps it. She works out a lot and she isn't sure if this helps. Pain rated as a 5/10 and is sharp in nature. No weakness, loss of sensation, tingling.   There is no immunization history for the selected administration types on file for this patient.   Colonoscopy: routine screening.  Mammogram: early screening ? BRCA testing.  Pap smear: 2019. Wnl.    Review of Systems  Constitutional: Negative for chills, fatigue and fever.  HENT: Negative for dental problem, ear pain, hearing loss and trouble swallowing.   Eyes: Negative for visual disturbance.  Respiratory: Negative for cough, chest tightness, shortness of breath and  wheezing.   Cardiovascular: Negative for chest pain, palpitations and leg swelling.  Gastrointestinal: Negative for abdominal pain, blood in stool, diarrhea, nausea and vomiting.  Endocrine: Negative for cold intolerance, polydipsia, polyphagia and polyuria.  Genitourinary: Negative for dysuria, frequency, hematuria, pelvic pain and urgency.  Musculoskeletal: Negative for arthralgias.  Skin: Negative for rash.  Neurological: Negative for dizziness, light-headedness and headaches.  Psychiatric/Behavioral: Negative for dysphoric mood and sleep disturbance. The patient is not nervous/anxious.     Allergies Patient has No Known Allergies.  Past Medical History Patient  has a past medical history of ASD (atrial septal defect), H/O varicella, and Headache(784.0).  Surgical History Patient  has a past surgical history that includes ASD repair (1992).  Family History Pateint's family history is not on file.  Social History Patient  reports that she has never smoked. She has never used smokeless tobacco. She reports that she does not drink alcohol or use drugs.    Objective: Vitals:   07/17/19 0830  BP: 110/60  Pulse: 73  Temp: 97.7 F (36.5 C)  TempSrc: Temporal  SpO2: 98%  Weight: 125 lb 9.6 oz (57 kg)  Height: '5\' 3"'$  (1.6 m)    Body mass index is 22.25 kg/m.  Physical Exam Vitals reviewed.  Constitutional:      Appearance: Normal appearance. She is well-developed and normal weight.  HENT:     Head: Normocephalic and atraumatic.     Right Ear: Tympanic membrane, ear canal and  external ear normal.     Left Ear: Tympanic membrane, ear canal and external ear normal.     Nose: Nose normal.     Mouth/Throat:     Mouth: Mucous membranes are moist.  Eyes:     Extraocular Movements: Extraocular movements intact.     Conjunctiva/sclera: Conjunctivae normal.     Pupils: Pupils are equal, round, and reactive to light.  Neck:     Thyroid: No thyromegaly.  Cardiovascular:      Rate and Rhythm: Normal rate and regular rhythm.     Pulses: Normal pulses.     Heart sounds: Normal heart sounds. No murmur.  Pulmonary:     Effort: Pulmonary effort is normal.     Breath sounds: Normal breath sounds.  Abdominal:     General: Abdomen is flat. Bowel sounds are normal. There is no distension.     Palpations: Abdomen is soft.     Tenderness: There is no abdominal tenderness.  Musculoskeletal:        General: Tenderness (right SI joint area. gait normal. strenght intact. ) present.     Cervical back: Normal range of motion and neck supple.  Lymphadenopathy:     Cervical: No cervical adenopathy.  Skin:    General: Skin is warm and dry.     Capillary Refill: Capillary refill takes less than 2 seconds.     Findings: No rash.  Neurological:     General: No focal deficit present.     Mental Status: She is alert and oriented to person, place, and time.     Cranial Nerves: No cranial nerve deficit.     Coordination: Coordination normal.     Deep Tendon Reflexes: Reflexes normal.  Psychiatric:        Behavior: Behavior normal.      Office Visit from 07/17/2019 in [place]  PHQ-2 Total Score  0          GAD 7 : Generalized Anxiety Score 07/17/2019 06/23/2018  Nervous, Anxious, on Edge 2 2  Control/stop worrying 0 1  Worry too much - different things 1 3  Trouble relaxing 1 2  Restless 0 1  Easily annoyed or irritable 1 3  Afraid - awful might happen 0 1  Total GAD 7 Score 5 13  Anxiety Difficulty Somewhat difficult Somewhat difficult      Assessment/plan: 1. Annual physical exam Routine lab work today.she is fasting. HM reviewed and UTD. She has had her tdap in 2019, but will check for records. Very healthy. Continue active and healthy lifestyle. F/u in one year or as needed.  Patient counseling _0    Nutrition: Stressed importance of moderation in sodium/caffeine intake, saturated fat and cholesterol, caloric balance, sufficient  intake of fresh fruits, vegetables, fiber, calcium, iron, and 1 mg of folate supplement per day (for females capable of pregnancy).  _1    Stressed the importance of regular exercise.   _2    Substance Abuse: Discussed cessation/primary prevention of tobacco, alcohol, or other drug use; driving or other dangerous activities under the influence; availability of treatment for abuse.   _3    Injury prevention: Discussed safety belts, safety helmets, smoke detector, smoking near bedding or upholstery.   _4    Sexuality: Discussed sexually transmitted diseases, partner selection, use of condoms, avoidance of unintended pregnancy  and contraceptive alternatives.  _5    Dental health: Discussed importance of regular tooth brushing, flossing, and dental visits.  _6    Health maintenance and immunizations reviewed. Please  refer to Health maintenance section.    - CBC with Differential/Platelet - Comprehensive metabolic panel - Lipid panel - TSH  2. Vitamin D deficiency Not on daily replacement. Did high dose replacement and none after this.  - VITAMIN D 25 Hydroxy (Vit-D Deficiency, Fractures)  3. Anxiety GAD7 significantly improved and off medication. Continue conservative treatment CBD oil prn. If she feels like getting out of hand with conservative measures she is to let me know/return.   4. Right SI pain -handout given of exercises to do at home -recommended yoga and voltaren gel QID -referral to sports medicine-dr. rigby. I think she would do well with adjustment.      This visit occurred during the SARS-CoV-2 public health emergency.  Safety protocols were in place, including screening questions prior to the visit, additional usage of staff PPE, and extensive cleaning of exam room while observing appropriate contact time as indicated for disinfecting solutions.     Return in about 1 year (around 07/16/2020) for annual. .     Orma Flaming, MD [place]  07/17/2019

## 2019-07-17 NOTE — Patient Instructions (Signed)
For back pain: referral to sports medicine. They will call you with this.  Get over the counter voltaren gel and put on back 4x/day.  Heating pad/yoga.   Labs today.    Preventive Care 80-35 Years Old, Female Preventive care refers to visits with your health care provider and lifestyle choices that can promote health and wellness. This includes:  A yearly physical exam. This may also be called an annual well check.  Regular dental visits and eye exams.  Immunizations.  Screening for certain conditions.  Healthy lifestyle choices, such as eating a healthy diet, getting regular exercise, not using drugs or products that contain nicotine and tobacco, and limiting alcohol use. What can I expect for my preventive care visit? Physical exam Your health care provider will check your:  Height and weight. This may be used to calculate body mass index (BMI), which tells if you are at a healthy weight.  Heart rate and blood pressure.  Skin for abnormal spots. Counseling Your health care provider may ask you questions about your:  Alcohol, tobacco, and drug use.  Emotional well-being.  Home and relationship well-being.  Sexual activity.  Eating habits.  Work and work Statistician.  Method of birth control.  Menstrual cycle.  Pregnancy history. What immunizations do I need?  Influenza (flu) vaccine  This is recommended every year. Tetanus, diphtheria, and pertussis (Tdap) vaccine  You may need a Td booster every 10 years. Varicella (chickenpox) vaccine  You may need this if you have not been vaccinated. Human papillomavirus (HPV) vaccine  If recommended by your health care provider, you may need three doses over 6 months. Measles, mumps, and rubella (MMR) vaccine  You may need at least one dose of MMR. You may also need a second dose. Meningococcal conjugate (MenACWY) vaccine  One dose is recommended if you are age 40-21 years and a first-year college student  living in a residence hall, or if you have one of several medical conditions. You may also need additional booster doses. Pneumococcal conjugate (PCV13) vaccine  You may need this if you have certain conditions and were not previously vaccinated. Pneumococcal polysaccharide (PPSV23) vaccine  You may need one or two doses if you smoke cigarettes or if you have certain conditions. Hepatitis A vaccine  You may need this if you have certain conditions or if you travel or work in places where you may be exposed to hepatitis A. Hepatitis B vaccine  You may need this if you have certain conditions or if you travel or work in places where you may be exposed to hepatitis B. Haemophilus influenzae type b (Hib) vaccine  You may need this if you have certain conditions. You may receive vaccines as individual doses or as more than one vaccine together in one shot (combination vaccines). Talk with your health care provider about the risks and benefits of combination vaccines. What tests do I need?  Blood tests  Lipid and cholesterol levels. These may be checked every 5 years starting at age 27.  Hepatitis C test.  Hepatitis B test. Screening  Diabetes screening. This is done by checking your blood sugar (glucose) after you have not eaten for a while (fasting).  Sexually transmitted disease (STD) testing.  BRCA-related cancer screening. This may be done if you have a family history of breast, ovarian, tubal, or peritoneal cancers.  Pelvic exam and Pap test. This may be done every 3 years starting at age 40. Starting at age 27, this may be done  every 5 years if you have a Pap test in combination with an HPV test. Talk with your health care provider about your test results, treatment options, and if necessary, the need for more tests. Follow these instructions at home: Eating and drinking   Eat a diet that includes fresh fruits and vegetables, whole grains, lean protein, and low-fat  dairy.  Take vitamin and mineral supplements as recommended by your health care provider.  Do not drink alcohol if: ? Your health care provider tells you not to drink. ? You are pregnant, may be pregnant, or are planning to become pregnant.  If you drink alcohol: ? Limit how much you have to 0-1 drink a day. ? Be aware of how much alcohol is in your drink. In the [place] one drink equals one 12 oz bottle of beer (355 mL), one 5 oz glass of wine (148 mL), or one 1 oz glass of hard liquor (44 mL). Lifestyle  Take daily care of your teeth and gums.  Stay active. Exercise for at least 30 minutes on 5 or more days each week.  Do not use any products that contain nicotine or tobacco, such as cigarettes, e-cigarettes, and chewing tobacco. If you need help quitting, ask your health care provider.  If you are sexually active, practice safe sex. Use a condom or other form of birth control (contraception) in order to prevent pregnancy and STIs (sexually transmitted infections). If you plan to become pregnant, see your health care provider for a preconception visit. What's next?  Visit your health care provider once a year for a well check visit.  Ask your health care provider how often you should have your eyes and teeth checked.  Stay up to date on all vaccines. This information is not intended to replace advice given to you by your health care provider. Make sure you discuss any questions you have with your health care provider. Document Revised: 10/17/2017 Document Reviewed: 10/17/2017 Elsevier Patient Education  2020 [employer]  

## 2019-07-27 ENCOUNTER — Other Ambulatory Visit: Payer: Self-pay | Admitting: Sports Medicine

## 2019-07-27 ENCOUNTER — Ambulatory Visit
Admission: RE | Admit: 2019-07-27 | Discharge: 2019-07-27 | Disposition: A | Discharge status: Home / Self Care | Payer: 59 | Source: Ambulatory Visit | Attending: Sports Medicine | Admitting: Sports Medicine

## 2019-07-27 ENCOUNTER — Other Ambulatory Visit: Payer: Self-pay

## 2019-07-27 DIAGNOSIS — M545 Low back pain, unspecified: Secondary | ICD-10-CM

## 2019-08-03 ENCOUNTER — Other Ambulatory Visit: Payer: Self-pay | Admitting: Family Medicine

## 2019-08-03 MED ORDER — FLUOXETINE HCL 10 MG PO CAPS: 10.0000 mg | ORAL_CAPSULE | Freq: Every day | ORAL | 1 refills | Status: DC

## 2019-08-07 ENCOUNTER — Ambulatory Visit: Payer: 59 | Admitting: Family Medicine

## 2019-09-07 ENCOUNTER — Encounter: Payer: Self-pay | Admitting: Family Medicine

## 2019-09-07 ENCOUNTER — Other Ambulatory Visit: Payer: Self-pay

## 2019-09-07 ENCOUNTER — Ambulatory Visit (INDEPENDENT_AMBULATORY_CARE_PROVIDER_SITE_OTHER): Payer: 59 | Admitting: Family Medicine

## 2019-09-07 VITALS — BP 98/62 | HR 61 | Temp 97.3°F | Ht 63.0 in | Wt 124.0 lb

## 2019-09-07 DIAGNOSIS — F329 Major depressive disorder, single episode, unspecified: Secondary | ICD-10-CM

## 2019-09-07 DIAGNOSIS — F32A Depression, unspecified: Secondary | ICD-10-CM

## 2019-09-07 NOTE — Progress Notes (Signed)
Patient: [name] MRN: [id number] DOB: Mar 22, 1985 PCP: Orland Mustard, MD     Subjective:  Chief Complaint  Patient presents with  . Depression    HPI: The patient is a 34 y.o. female who presents today for Depression. She says that she is doing well on the Prozac. We started her on a very low dose prozac about a month ago. Discussed dosage and she preferred to go with the 10mg  dose. She has figured it is when she is on her period only. July was better and isn't sure if it is because she was on her prozac or if situation was just better. She has also started counseling and is working on techniques to use. Denies any side effects from medication.   Review of Systems  Respiratory: Negative for chest tightness and shortness of breath.   Cardiovascular: Negative for chest pain and palpitations.  Gastrointestinal: Negative for abdominal pain.  Neurological: Negative for dizziness, light-headedness and headaches.    Allergies Patient has No Known Allergies.  Past Medical History Patient  has a past medical history of ASD (atrial septal defect), H/O varicella, and Headache(784.0).  Surgical History Patient  has a past surgical history that includes ASD repair (1992).  Family History Pateint's family history is not on file.  Social History Patient  reports that she has never smoked. She has never used smokeless tobacco. She reports that she does not drink alcohol and does not use drugs.    Objective: Vitals:   09/07/19 0806  BP: 98/62  Pulse: 61  Temp: (!) 97.3 F (36.3 C)  TempSrc: Temporal  SpO2: 99%  Weight: 124 lb (56.2 kg)  Height: 5\' 3"  (1.6 m)    Body mass index is 21.97 kg/m.  Physical Exam Vitals reviewed.  Constitutional:      Appearance: Normal appearance. She is normal weight.  HENT:     Head: Normocephalic and atraumatic.  Cardiovascular:     Rate and Rhythm: Normal rate and regular rhythm.     Heart sounds: Normal heart sounds.  Pulmonary:      Effort: Pulmonary effort is normal.     Breath sounds: Normal breath sounds.  Abdominal:     General: Abdomen is flat. Bowel sounds are normal.     Palpations: Abdomen is soft.  Skin:    General: Skin is warm.     Capillary Refill: Capillary refill takes less than 2 seconds.  Neurological:     General: No focal deficit present.     Mental Status: She is alert and oriented to person, place, and time.  Psychiatric:        Mood and Affect: Mood normal.        Behavior: Behavior normal.      Office Visit from 09/07/2019 in [facility]  PHQ-9 Total Score 3         Assessment/plan: 1. Depression, unspecified depression type phq9 with very mild score. Related to periods, no desire to trial OCP. Doing well on prozac and will continue the 10mg . Will give this one more month and see how she does and she will see if she needs to increase to 20mg  or go off of this, especially since she is in counseling. Will email me and let me know.      This visit occurred during the SARS-CoV-2 public health emergency.  Safety protocols were in place, including screening questions prior to the visit, additional usage of staff PPE, and extensive cleaning of exam room  while observing appropriate contact time as indicated for disinfecting solutions.    Return if symptoms worsen or fail to improve.   Orland Mustard, MD [facility]   09/07/2019

## 2019-09-07 NOTE — Patient Instructions (Signed)
-  continue 10mg , can go up to 20mg  if needed, but let me know so I can send in correct dosage.   -glad you are doing well! Good to see you!  Dr. 

## 2019-09-21 ENCOUNTER — Other Ambulatory Visit: Payer: Self-pay | Admitting: Family Medicine

## 2019-10-12 ENCOUNTER — Encounter: Payer: Self-pay | Admitting: Family Medicine

## 2019-10-16 MED ORDER — FLUOXETINE HCL 20 MG PO TABS: 20.0000 mg | ORAL_TABLET | Freq: Every day | ORAL | 1 refills | Status: DC

## 2019-10-21 MED ORDER — FLUOXETINE HCL 20 MG PO CAPS: 20.0000 mg | ORAL_CAPSULE | Freq: Every day | ORAL | 1 refills | Status: DC

## 2019-10-21 NOTE — Addendum Note (Signed)
Addended by: Orland Mustard on: 10/21/2019 03:54 PM   Modules accepted: Orders

## 2019-11-27 ENCOUNTER — Other Ambulatory Visit: Payer: Self-pay | Admitting: Family Medicine

## 2019-11-27 NOTE — Telephone Encounter (Signed)
LAST APPOINTMENT DATE: 09/21/2019   NEXT APPOINTMENT DATE: Visit date not found  Rx Prozac  LAST REFILL: 0901/2021  QTY: 90 Ref 1

## 2019-12-14 ENCOUNTER — Encounter: Payer: Self-pay | Admitting: Family Medicine

## 2019-12-14 ENCOUNTER — Ambulatory Visit (INDEPENDENT_AMBULATORY_CARE_PROVIDER_SITE_OTHER): Payer: 59 | Admitting: Family Medicine

## 2019-12-14 ENCOUNTER — Other Ambulatory Visit: Payer: Self-pay

## 2019-12-14 VITALS — BP 114/76 | HR 86 | Temp 98.4°F | Ht 63.0 in | Wt 125.6 lb

## 2019-12-14 DIAGNOSIS — R509 Fever, unspecified: Secondary | ICD-10-CM

## 2019-12-14 DIAGNOSIS — J029 Acute pharyngitis, unspecified: Secondary | ICD-10-CM

## 2019-12-14 LAB — POCT RAPID STREP A (OFFICE): Rapid Strep A Screen: NEGATIVE

## 2019-12-14 MED ORDER — AMOXICILLIN 875 MG PO TABS: 875.0000 mg | ORAL_TABLET | Freq: Two times a day (BID) | ORAL | 0 refills | Status: DC

## 2019-12-14 NOTE — Progress Notes (Signed)
Patient: [name] MRN: [id number] DOB: [birth year] PCP: Orland Mustard, MD     Subjective:  Chief Complaint  Patient presents with  . Sore Throat  . Fever    Highest at 101, last night  . Fatigue  . Chills    HPI: The patient is a 34 y.o. female who presents today for sore throat, starting on Saturday night. She says she had fever at 101. She took OTC tylenol to reduced temp. She states her throat started to hurt Saturday night and got worse on Sunday. It woke her up all last night because it hurt to swallow. She was cold sweating last night and thinks her fever broke. She has a headache that is constant as well. She denies any respiratory symptoms like cough, congestion. No loss of taste or smell. She does have fatigue and some achiness. No known sick contacts.   Review of Systems  Constitutional: Positive for chills and fatigue. Negative for fever.  HENT: Positive for sore throat. Negative for congestion, sinus pressure and sinus pain.   Respiratory: Negative for cough and shortness of breath.   Cardiovascular: Negative for chest pain.  Neurological: Positive for headaches.    Allergies Patient has No Known Allergies.  Past Medical History Patient  has a past medical history of ASD (atrial septal defect), H/O varicella, and Headache(784.0).  Surgical History Patient  has a past surgical history that includes ASD repair (1992).  Family History Pateint's family history is not on file.  Social History Patient  reports that she has never smoked. She has never used smokeless tobacco. She reports that she does not drink alcohol and does not use drugs.    Objective: Vitals:   12/14/19 1027  BP: 114/76  Pulse: 86  Temp: 98.4 F (36.9 C)  TempSrc: Temporal  SpO2: 100%  Weight: 125 lb 9.6 oz (57 kg)  Height: 5\' 3"  (1.6 m)    Body mass index is 22.25 kg/m.  Physical Exam Vitals reviewed.  Constitutional:      Appearance: She is well-developed and normal  weight.  HENT:     Head: Normocephalic and atraumatic.     Right Ear: Tympanic membrane and ear canal normal.     Left Ear: Tympanic membrane and ear canal normal.     Nose: No congestion or rhinorrhea.     Mouth/Throat:     Mouth: Mucous membranes are moist.     Pharynx: Posterior oropharyngeal erythema present.     Tonsils: No tonsillar exudate. 0 on the right. 0 on the left.     Comments: Erythematous faucial pillars and tonsils with R>L. No edema or enlargement of tonsils. No exudate Eyes:     Conjunctiva/sclera: Conjunctivae normal.     Pupils: Pupils are equal, round, and reactive to light.  Cardiovascular:     Rate and Rhythm: Normal rate and regular rhythm.     Heart sounds: Normal heart sounds.  Pulmonary:     Effort: Pulmonary effort is normal.     Breath sounds: Normal breath sounds.  Abdominal:     General: Bowel sounds are normal.     Palpations: Abdomen is soft.  Musculoskeletal:     Cervical back: Normal range of motion and neck supple.  Lymphadenopathy:     Cervical: Cervical adenopathy present.  Skin:    General: Skin is warm.     Capillary Refill: Capillary refill takes less than 2 seconds.  Neurological:     General: No focal deficit present.  Mental Status: She is alert and oriented to person, place, and time.  Psychiatric:        Mood and Affect: Mood normal.        Behavior: Behavior normal.        Rapid strep: negative  Assessment/plan: 1. Sore throat Treating for tonsillitis with fever/chills with amoxicillin.  Recommended fever control with anti-pyretics, warm salt water gurgles and throw away tooth brush after 24 hours. Let me know if not getting better.  - Novel Coronavirus, NAA ([facility]); Future - POCT rapid strep A - Novel Coronavirus, NAA ([facility])  2. Fever and chills Sending for covid test. Has a lot of symptoms with fever, aches, headache. Want her to go ahead and start treatment as delaying treatment creates more risk of viral  replication and we have better success with earlier treatment.   -starting her on treatment nutraceutical bundle including zinc sulfate, vit D, vit C, quercetin and melatonin 5-15+ days depending on symptoms.  -iodine 1% nasal drop 1-2x/day -ivermectin X5 days. Discussed with family that this is still considered experimental; however, multiple RCT have been done that have shown significant benefit with it's use. They would like to proceed.  -ASA 325mg  (no contraindication) x5-15 days -discussed hold all NSAIDs, would just do tylenol since on ASA.  -gargle mouthwash TID -outside as much as possible.  -pulse ox >93-94% -prone sleeping if possible.   -has no respiratory symptoms but discussed if these start to develop we may need to add on pulmicort and steroids as well as other medication. She is to keep in close contact with me.   - Novel Coronavirus, NAA ([facility]); Future - Novel Coronavirus, NAA ([facility])    This visit occurred during the SARS-CoV-2 public health emergency.  Safety protocols were in place, including screening questions prior to the visit, additional usage of staff PPE, and extensive cleaning of exam room while observing appropriate contact time as indicated for disinfecting solutions.    Return if symptoms worsen or fail to improve.   , MD [facility]   12/14/2019

## 2019-12-14 NOTE — Patient Instructions (Addendum)
Preventative vitamins for covid, but once covid dies down you can stop this..  Get pulse ox off [employer], because you need to monitor oxygen if you have covid. Needs to stay above 93-94%.   1) zinc 50mg /day 2) vitamin C : 1000mg /day 3) vitamin D 3: 2000IU/day 4) quercetin: 250-500mg /day 5) melatonin 6mg  at night.    IF you get covid.  1) start high dose 325mg  daily for 5-10 days 2) increase the above vitamins to the following.  1) zinc 100mg /day 2) Vitamin C: 3000 mg/day 3) vitaminD3: 5000IU/day 4) melatonin 10mg Marland Kitchen  Start mouth wash like listerine 3x/day Iodine 1& nasal drops 1-2x/day.    Ivermectin daily x 5 days WITH FOOD!!!!!!!  Antibiotic for throat, ?tonsiliits. Even though strep negative.

## 2019-12-15 LAB — NOVEL CORONAVIRUS, NAA: SARS-CoV-2, NAA: NOT DETECTED

## 2019-12-15 LAB — SARS-COV-2, NAA 2 DAY TAT

## 2019-12-16 ENCOUNTER — Encounter: Payer: Self-pay | Admitting: Family Medicine

## 2020-04-12 ENCOUNTER — Other Ambulatory Visit: Payer: Self-pay | Admitting: Family Medicine

## 2020-08-27 IMAGING — CR DG LUMBAR SPINE COMPLETE 4+V
3 series · 3 of 3 positions shown · non-contrast
Comparison: None.

CLINICAL DATA: Low back pain with right-sided radiculopathy

EXAM:
LUMBAR SPINE - COMPLETE 4+ VIEW

[t lumbar spine obl]
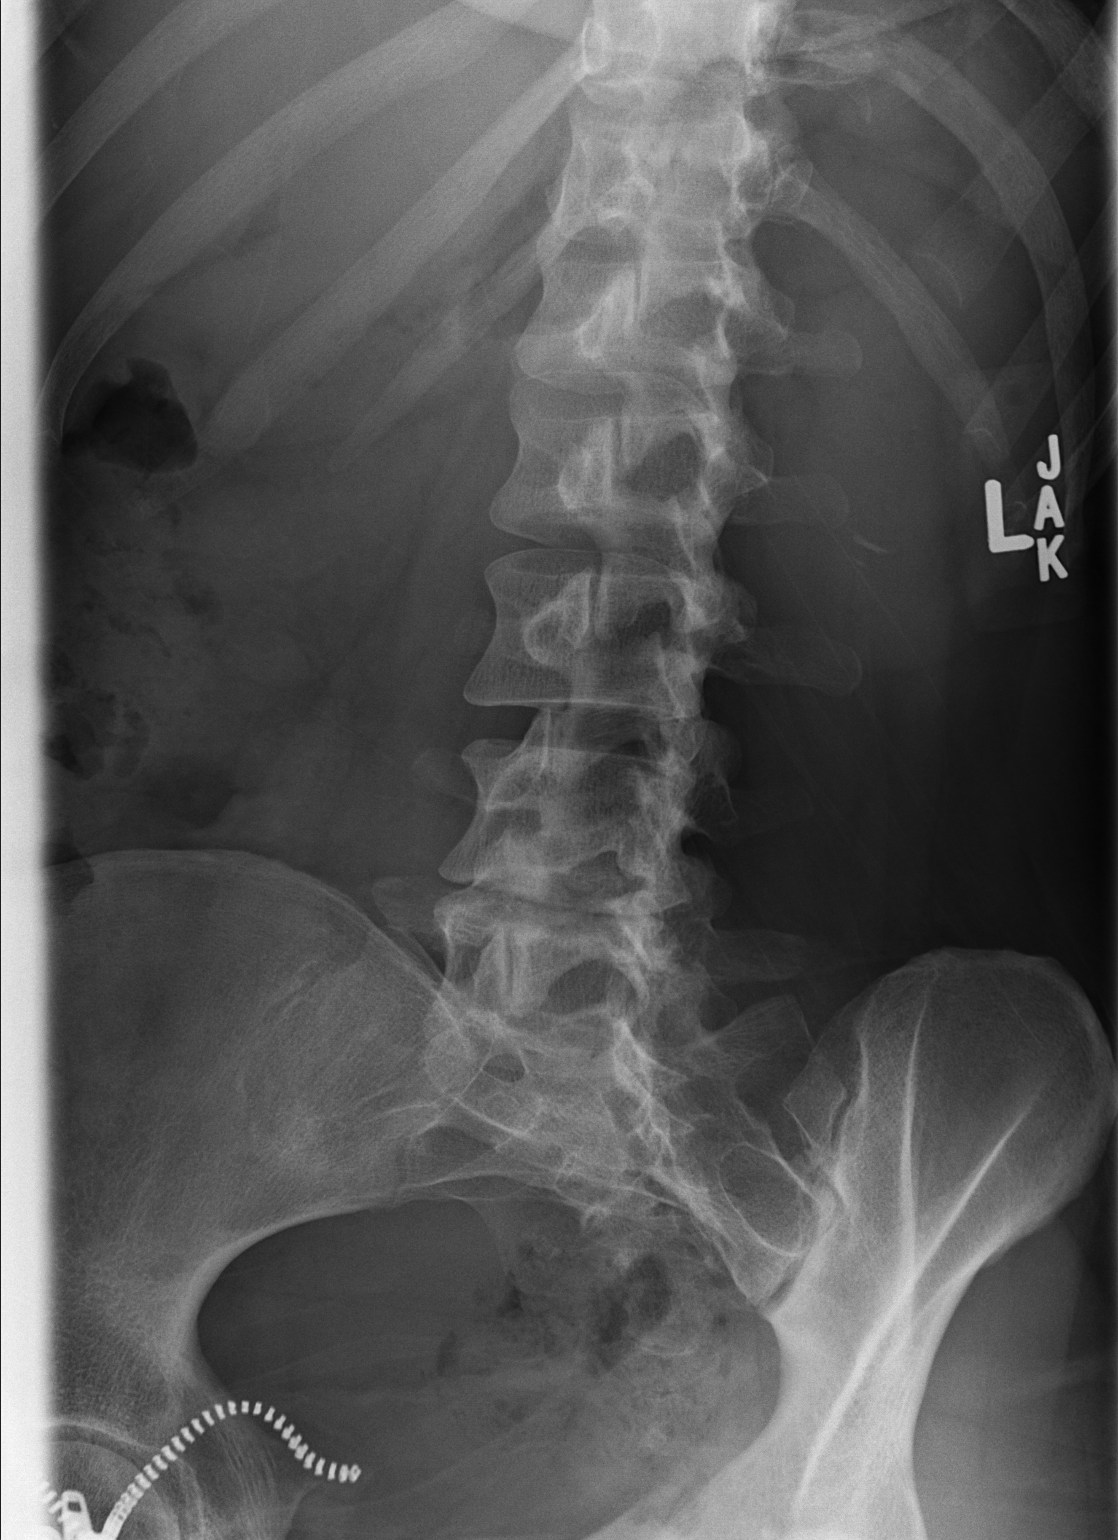

[t lumbar spine lat]
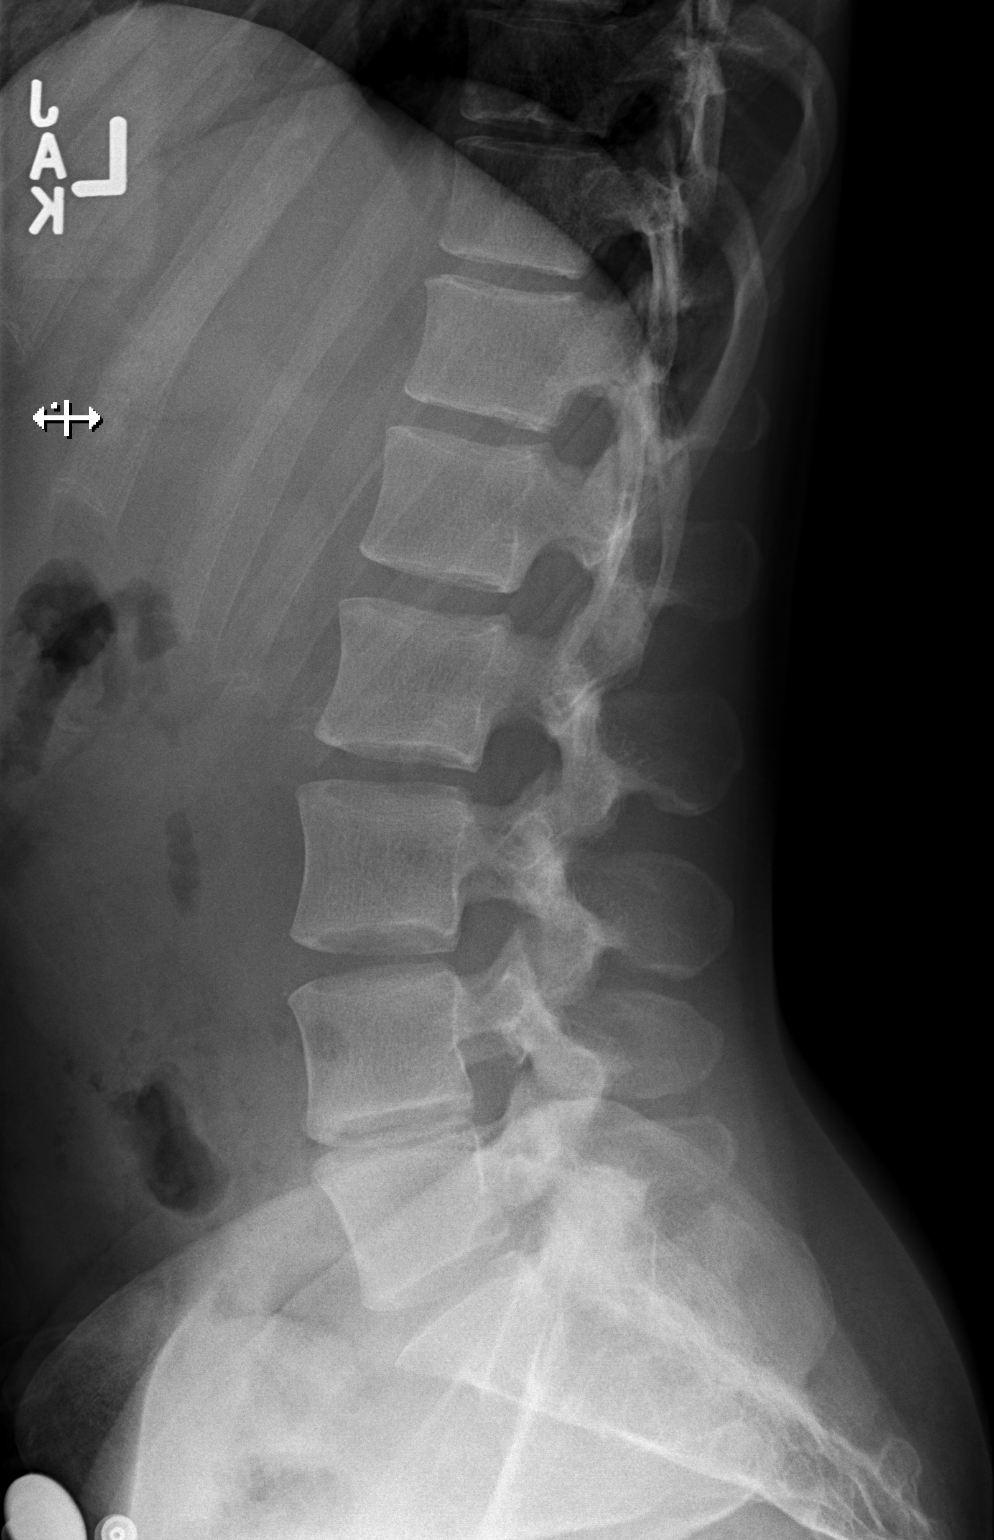

[t lumbar l-5 s-1 spot]
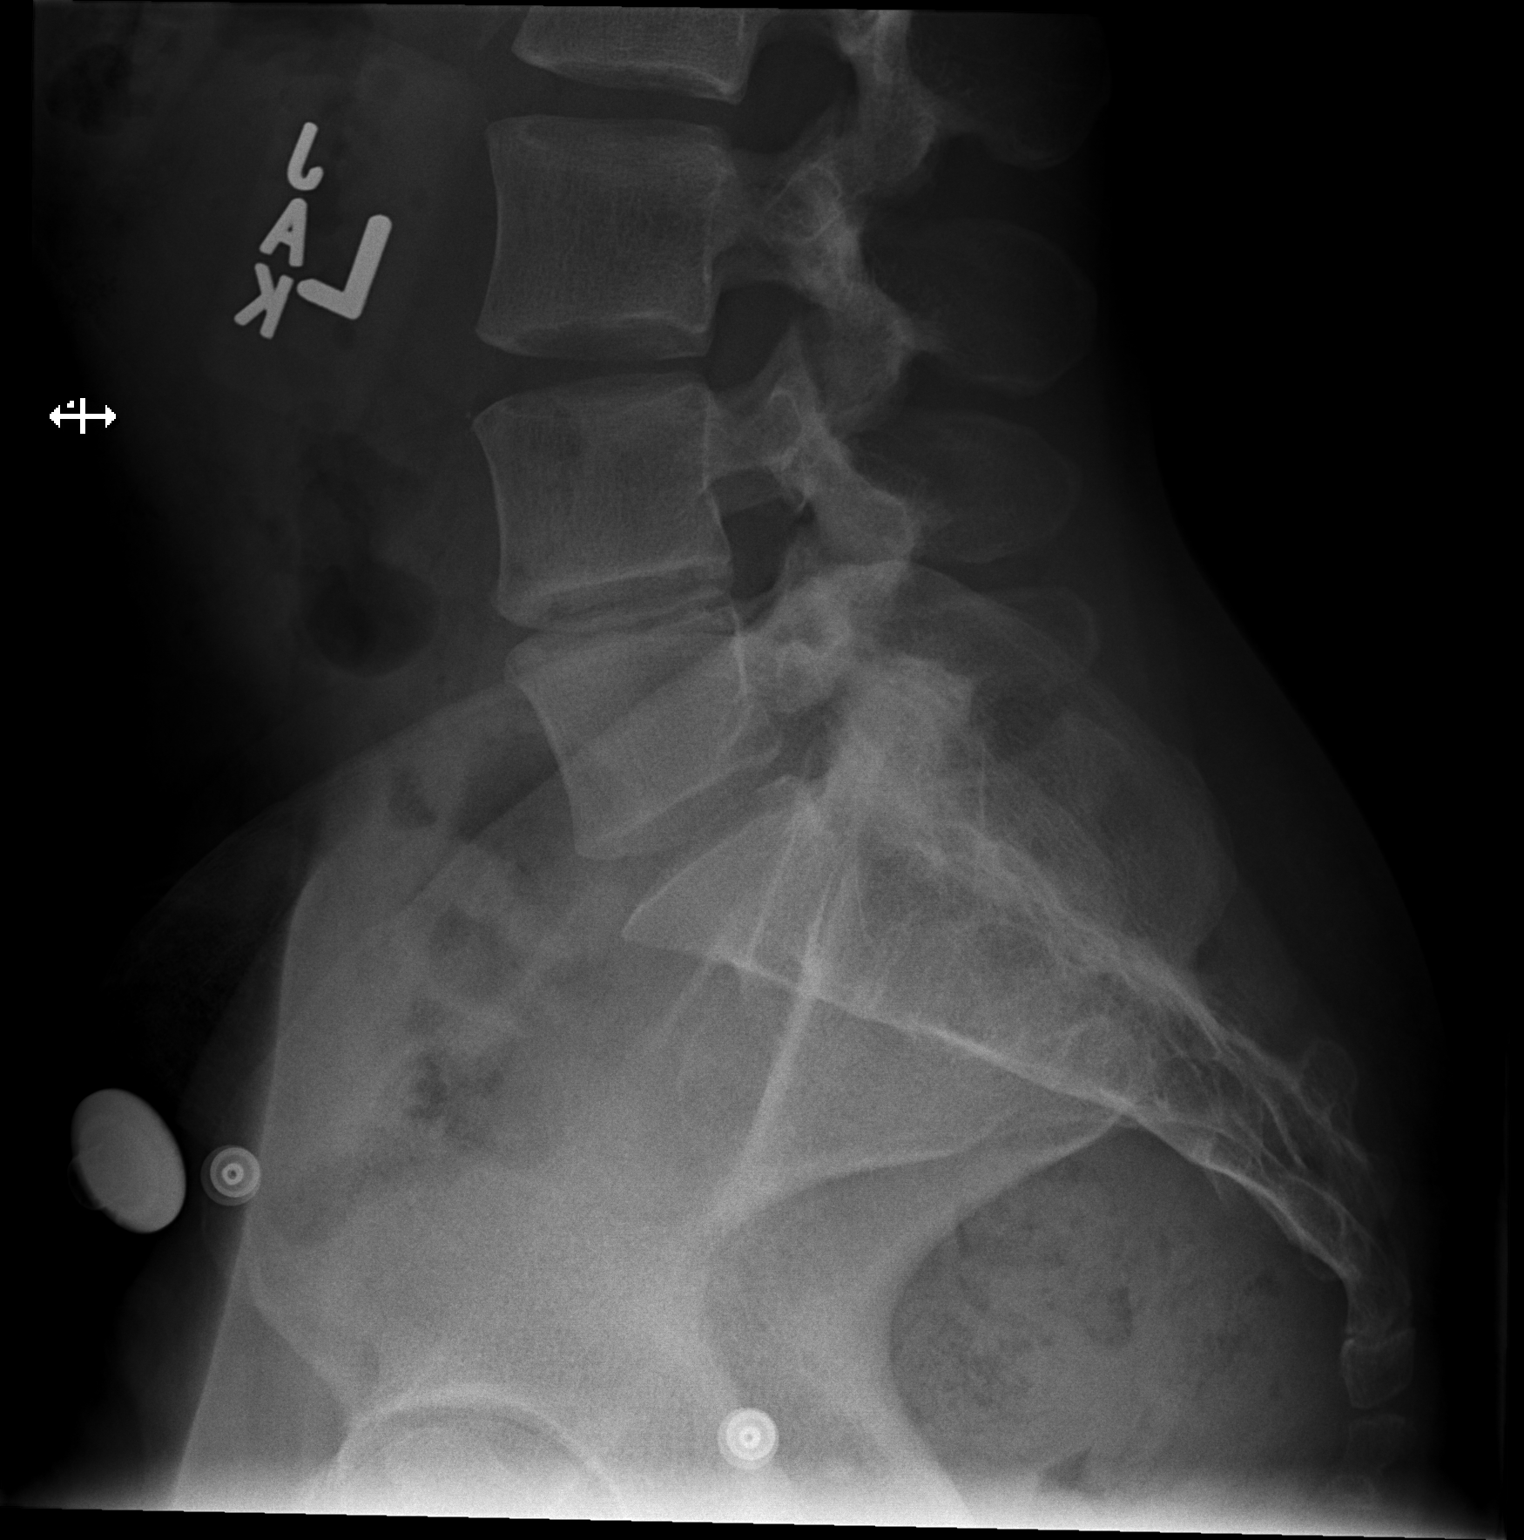

[3 of 3 positions shown; findings below may reference images not displayed]

FINDINGS: Slight rotary levocurvature of the lower lumbar spine. There is no
evidence of lumbar spine fracture. No static listhesis. Mild disc
height loss of L4-5. The remaining intervertebral disc spaces are
maintained.
IMPRESSION: 1. Mild disc height loss of L4-5. No acute findings.
2. Slight rotary levocurvature of the lower lumbar spine.

## 2020-11-29 ENCOUNTER — Telehealth: Payer: Self-pay

## 2020-11-29 NOTE — Telephone Encounter (Signed)
LVM asking the patient to call back to get scheduled for a TOC 

## 2022-10-05 ENCOUNTER — Encounter: Payer: Self-pay | Admitting: Plastic Surgery

## 2022-10-17 ENCOUNTER — Inpatient Hospital Stay: Payer: 59 | Attending: Hematology and Oncology | Admitting: Hematology and Oncology

## 2022-10-17 ENCOUNTER — Inpatient Hospital Stay: Payer: 59

## 2022-10-17 VITALS — BP 131/87 | HR 70 | Temp 98.2°F | Resp 18 | Ht 63.0 in | Wt 127.2 lb

## 2022-10-17 DIAGNOSIS — D0510 Intraductal carcinoma in situ of unspecified breast: Secondary | ICD-10-CM | POA: Insufficient documentation

## 2022-10-17 DIAGNOSIS — D0511 Intraductal carcinoma in situ of right breast: Secondary | ICD-10-CM | POA: Insufficient documentation

## 2022-10-17 DIAGNOSIS — Z803 Family history of malignant neoplasm of breast: Secondary | ICD-10-CM | POA: Insufficient documentation

## 2022-10-17 NOTE — Assessment & Plan Note (Addendum)
This is a pleasant 37 year old premenopausal female patient with newly diagnosed right breast DCIS, ER/PR positive referred to breast clinic for additional recommendations.  She was seen by surgical oncology in [place], recommendation was to consider an MRI, genetic testing and possibly additional surgery to assess for clear margins.  She however wanted to transfer her care to [facility] since she lives in [place] and her mom was also treated at this location.  She tells me that her genetic testing came back negative, I do not have a copy of these results.  She is already scheduled to see Dr. Dwain Sarna on Friday for additional recommendations.  We have discussed the following details about DCIS  Pathology review: I discussed with the patient the difference between DCIS and invasive breast cancer. It is considered a precancerous lesion. DCIS is classified as a Stage 0 breast cancer. It is generally detected through mammograms as calcifications. We discussed the significance of grades and its impact on prognosis. We also discussed the importance of ER and PR receptors and their implications to adjuvant treatment options. Prognosis of DCIS dependence on grade and degree of comedo necrosis. It is anticipated that if not treated, 20-30% of DCIS can develop into invasive breast cancer.  Recommendation: 1. Breast conserving surgery 2. Followed by adjuvant radiation therapy 3. Followed by antiestrogen therapy with tamoxifen  Tamoxifen counseling: We discussed the risks and benefits of tamoxifen. These include but not limited to insomnia, hot flashes, mood changes, vaginal dryness, and weight gain. Although rare, serious side effects including endometrial cancer, risk of blood clots were also discussed. We strongly believe that the benefits far outweigh the risks. Planned treatment duration is 5 years. We have also discussed about standard dose vs low dose tamoxifen in setting of DCIS. RTC in 6-8 weeks.

## 2022-10-17 NOTE — Progress Notes (Signed)
[facility] CONSULT NOTE  Patient Care Team: Pcp, No as PCP - General Myna Hidalgo, DO as Consulting Physician (Obstetrics and Gynecology)  CHIEF COMPLAINTS/PURPOSE OF CONSULTATION:  Newly diagnosed breast cancer  HISTORY OF PRESENTING ILLNESS:  [name] 37 y.o. female is here because of recent diagnosis of right breast DCIS  I reviewed her records extensively and collaborated the history with the patient.  SUMMARY OF ONCOLOGIC HISTORY: Oncology History  Ductal carcinoma in situ (DCIS) of breast  10/17/2022 Initial Diagnosis   Ductal carcinoma in situ (DCIS) of breast   10/17/2022 Cancer Staging   Staging form: Breast, AJCC 8th Edition - Clinical: Stage 0 (cTis (DCIS), cN0, cM0, G2, ER+, PR+) - Signed by Rachel Moulds, MD on 10/17/2022 Histologic grading system: 3 grade system    This is a very pleasant 37 year old premenopausal female patient who happened to have breast reduction at [facility] plastic surgery and was found to have DCIS as an incidental finding in the right breast, seen by Dr. Yetta Numbers surgical oncology in [place], transferring her care to [facility] since her mom was treated locally in [place] and since she is from [place].  She mentions family history of breast cancer in her mom, maternal and paternal grandmother and some great aunts.  She had genetic testing at [facility], patient states that the genetic testing was negative, her mom also had no evidence of BRCA gene. Pathology from this showed grade 2 DCIS, no necrosis, ER/PR positive.  She apparently had a mammogram at [facility] in April which was unremarkable.  She was not doing any MRI screening.  She is otherwise healthy, has had 4 children, age at first childbirth was around 62, she breast-fed all her 4 kids at least for a year.  She took birth control for about 7 years or so.  No history of hormone supplementation.  Rest of the pertinent 10 point ROS reviewed and negative  MEDICAL HISTORY:   Past Medical History:  Diagnosis Date   ASD (atrial septal defect)    age 2   H/O varicella    Headache(784.0)     SURGICAL HISTORY: Past Surgical History:  Procedure Laterality Date   ASD REPAIR  1992    SOCIAL HISTORY: Social History   Socioeconomic History   Marital status: Married    Spouse name: Not on file   Number of children: Not on file   Years of education: Not on file   Highest education level: Not on file  Occupational History   Not on file  Tobacco Use   Smoking status: Never   Smokeless tobacco: Never  Vaping Use   Vaping status: Never Used  Substance and Sexual Activity   Alcohol use: No    Comment: not while preg   Drug use: No   Sexual activity: Yes    Birth control/protection: None  Other Topics Concern   Not on file  Social History Narrative   Not on file   Social Determinants of Health   Financial Resource Strain: Low Risk  (09/30/2022)   Received from [facility]   Overall Financial Resource Strain (CARDIA)    Difficulty of Paying Living Expenses: Not hard at all  Food Insecurity: No Food Insecurity (09/30/2022)   Received from [facility]   Hunger Vital Sign    Worried About Running Out of Food in the Last Year: Never true    Ran Out of Food in the Last Year: Never true  Transportation Needs: No Transportation Needs (09/30/2022)  Received from [facility] - Transportation    Lack of Transportation (Medical): No    Lack of Transportation (Non-Medical): No  Physical Activity: Sufficiently Active (09/30/2022)   Received from [facility]   Exercise Vital Sign    Days of Exercise per Week: 4 days    Minutes of Exercise per Session: 40 min  Stress: No Stress Concern Present (09/30/2022)   Received from [facility] - Occupational Stress Questionnaire    Feeling of Stress : Not at all  Social Connections: Socially Integrated (09/30/2022)   Received from [facility]   Social  Network    How would you rate your social network (family, work, friends)?: Good participation with social networks  Intimate Partner Violence: Not At Risk (09/30/2022)   Received from [facility]   HITS    Over the last 12 months how often did your partner physically hurt you?: 1    Over the last 12 months how often did your partner insult you or talk down to you?: 1    Over the last 12 months how often did your partner threaten you with physical harm?: 1    Over the last 12 months how often did your partner scream or curse at you?: 1    FAMILY HISTORY: No family history on file.  ALLERGIES:  has No Known Allergies.  MEDICATIONS:  Current Outpatient Medications  Medication Sig Dispense Refill   Multiple Vitamins-Minerals (ONE-A-DAY WOMENS PO) Take by mouth.     No current facility-administered medications for this visit.    REVIEW OF SYSTEMS:   Constitutional: Denies fevers, chills or abnormal night sweats Eyes: Denies blurriness of vision, double vision or watery eyes Ears, nose, mouth, throat, and face: Denies mucositis or sore throat Respiratory: Denies cough, dyspnea or wheezes Cardiovascular: Denies palpitation, chest discomfort or lower extremity swelling Gastrointestinal:  Denies nausea, heartburn or change in bowel habits Skin: Denies abnormal skin rashes Lymphatics: Denies new lymphadenopathy or easy bruising Neurological:Denies numbness, tingling or new weaknesses Behavioral/Psych: Mood is stable, no new changes  Breast: Denies any palpable lumps or discharge All other systems were reviewed with the patient and are negative.  PHYSICAL EXAMINATION: ECOG PERFORMANCE STATUS: 0 - Asymptomatic  Vitals:   10/17/22 1130  BP: 131/87  Pulse: 70  Resp: 18  Temp: 98.2 F (36.8 C)  SpO2: 100%   Filed Weights   10/17/22 1130  Weight: 127 lb 3.2 oz (57.7 kg)    GENERAL:alert, no distress and comfortable Rest of the physical exam deferred in lieu of  counseling   LABORATORY DATA:  I have reviewed the data as listed Lab Results  Component Value Date   WBC 4.1 07/17/2019   HGB 13.0 07/17/2019   HCT 39.2 07/17/2019   MCV 92.4 07/17/2019   PLT 213.0 07/17/2019   Lab Results  Component Value Date   NA 137 07/17/2019   K 4.4 07/17/2019   CL 104 07/17/2019   CO2 25 07/17/2019    RADIOGRAPHIC STUDIES: I have personally reviewed the radiological reports and agreed with the findings in the report.  ASSESSMENT AND PLAN:  Ductal carcinoma in situ (DCIS) of breast This is a pleasant 37 year old premenopausal female patient with newly diagnosed right breast DCIS, ER/PR positive referred to breast clinic for additional recommendations.  She was seen by surgical oncology in [place], recommendation was to consider an MRI, genetic testing and possibly additional surgery to assess for clear margins.  She however wanted to transfer her care to [facility] since she lives in [place] and her mom was also treated at this location.  She tells me that her genetic testing came back negative, I do not have a copy of these results.  She is already scheduled to see Dr. Dwain Sarna on Friday for additional recommendations.  We have discussed the following details about DCIS  Pathology review: I discussed with the patient the difference between DCIS and invasive breast cancer. It is considered a precancerous lesion. DCIS is classified as a Stage 0 breast cancer. It is generally detected through mammograms as calcifications. We discussed the significance of grades and its impact on prognosis. We also discussed the importance of ER and PR receptors and their implications to adjuvant treatment options. Prognosis of DCIS dependence on grade and degree of comedo necrosis. It is anticipated that if not treated, 20-30% of DCIS can develop into invasive breast cancer.  Recommendation: 1. Breast conserving surgery 2. Followed by adjuvant radiation therapy 3. Followed  by antiestrogen therapy with tamoxifen  Tamoxifen counseling: We discussed the risks and benefits of tamoxifen. These include but not limited to insomnia, hot flashes, mood changes, vaginal dryness, and weight gain. Although rare, serious side effects including endometrial cancer, risk of blood clots were also discussed. We strongly believe that the benefits far outweigh the risks. Planned treatment duration is 5 years. We have also discussed about standard dose vs low dose tamoxifen in setting of DCIS. RTC in 6-8 weeks.    All questions were answered. The patient knows to call the clinic with any problems, questions or concerns.    Rachel Moulds, MD 10/17/22

## 2022-10-18 ENCOUNTER — Encounter: Payer: Self-pay | Admitting: *Deleted

## 2022-10-24 ENCOUNTER — Other Ambulatory Visit: Payer: Self-pay | Admitting: General Surgery

## 2022-10-24 DIAGNOSIS — D0511 Intraductal carcinoma in situ of right breast: Secondary | ICD-10-CM

## 2022-10-26 ENCOUNTER — Encounter: Payer: Self-pay | Admitting: *Deleted

## 2022-11-06 ENCOUNTER — Telehealth: Payer: Self-pay | Admitting: Hematology and Oncology

## 2022-11-06 NOTE — Telephone Encounter (Signed)
Left patient a message in regards to rescheduled appointment times/dates

## 2022-12-07 ENCOUNTER — Ambulatory Visit
Admission: RE | Admit: 2022-12-07 | Discharge: 2022-12-07 | Disposition: A | Discharge status: Home / Self Care | Payer: 59 | Source: Ambulatory Visit | Attending: General Surgery | Admitting: General Surgery

## 2022-12-07 DIAGNOSIS — D0511 Intraductal carcinoma in situ of right breast: Secondary | ICD-10-CM

## 2022-12-07 MED ORDER — GADOPICLENOL 0.5 MMOL/ML IV SOLN
5.0000 mL | Freq: Once | INTRAVENOUS | Status: AC | PRN
  Administered 2022-12-07: 5 mL via INTRAVENOUS

## 2022-12-10 ENCOUNTER — Encounter: Payer: Self-pay | Admitting: *Deleted

## 2022-12-21 ENCOUNTER — Ambulatory Visit: Payer: 59 | Admitting: Hematology and Oncology

## 2022-12-27 ENCOUNTER — Ambulatory Visit: Payer: 59 | Admitting: Hematology and Oncology

## 2022-12-28 ENCOUNTER — Encounter: Payer: Self-pay | Admitting: *Deleted

## 2022-12-31 ENCOUNTER — Telehealth: Payer: Self-pay | Admitting: Hematology and Oncology

## 2022-12-31 NOTE — Telephone Encounter (Signed)
 Left patient a vm regarding upcoming appointment

## 2023-01-01 ENCOUNTER — Encounter: Payer: Self-pay | Admitting: *Deleted

## 2023-01-24 ENCOUNTER — Ambulatory Visit: Payer: 59 | Admitting: Hematology and Oncology

## 2023-01-25 ENCOUNTER — Encounter: Payer: Self-pay | Admitting: *Deleted

## 2023-02-01 ENCOUNTER — Telehealth: Payer: Self-pay | Admitting: Hematology and Oncology

## 2023-02-01 NOTE — Telephone Encounter (Signed)
Spoke with patient confirming upcoming appointment  

## 2023-02-07 ENCOUNTER — Encounter: Payer: Self-pay | Admitting: *Deleted

## 2023-02-28 ENCOUNTER — Ambulatory Visit: Payer: 59 | Admitting: Hematology and Oncology

## 2023-03-04 ENCOUNTER — Encounter: Payer: Self-pay | Admitting: *Deleted

## 2023-03-06 ENCOUNTER — Ambulatory Visit: Payer: 59 | Admitting: Hematology and Oncology
